# Patient Record
Sex: Female | Born: 1965 | Race: White | Hispanic: No | Marital: Single | Smoking: Current every day smoker
Health system: Southern US, Community
[De-identification: ages and names within clinical notes are randomized; demographics above are authoritative.]

## PROBLEM LIST (undated history)

## (undated) DIAGNOSIS — R002 Palpitations: Secondary | ICD-10-CM

## (undated) HISTORY — PX: APPENDECTOMY: SHX54

## (undated) HISTORY — PX: ABDOMINAL HYSTERECTOMY: SHX81

---

## 2015-08-26 DIAGNOSIS — N951 Menopausal and female climacteric states: Secondary | ICD-10-CM | POA: Insufficient documentation

## 2015-08-26 DIAGNOSIS — Z01419 Encounter for gynecological examination (general) (routine) without abnormal findings: Secondary | ICD-10-CM | POA: Insufficient documentation

## 2015-08-26 DIAGNOSIS — N809 Endometriosis, unspecified: Secondary | ICD-10-CM

## 2015-08-26 HISTORY — DX: Menopausal and female climacteric states: N95.1

## 2015-08-26 HISTORY — DX: Encounter for gynecological examination (general) (routine) without abnormal findings: Z01.419

## 2015-08-26 HISTORY — DX: Endometriosis, unspecified: N80.9

## 2015-09-16 DIAGNOSIS — Z90710 Acquired absence of both cervix and uterus: Secondary | ICD-10-CM

## 2015-09-16 HISTORY — DX: Acquired absence of both cervix and uterus: Z90.710

## 2015-11-28 DIAGNOSIS — A630 Anogenital (venereal) warts: Secondary | ICD-10-CM | POA: Insufficient documentation

## 2015-11-28 HISTORY — DX: Anogenital (venereal) warts: A63.0

## 2017-10-25 DIAGNOSIS — M79674 Pain in right toe(s): Secondary | ICD-10-CM

## 2017-10-25 DIAGNOSIS — G8929 Other chronic pain: Secondary | ICD-10-CM | POA: Insufficient documentation

## 2017-10-25 DIAGNOSIS — Z8781 Personal history of (healed) traumatic fracture: Secondary | ICD-10-CM | POA: Insufficient documentation

## 2017-10-25 DIAGNOSIS — R002 Palpitations: Secondary | ICD-10-CM

## 2017-10-25 HISTORY — DX: Personal history of (healed) traumatic fracture: Z87.81

## 2017-10-25 HISTORY — DX: Other chronic pain: G89.29

## 2017-10-25 HISTORY — DX: Palpitations: R00.2

## 2018-01-13 ENCOUNTER — Encounter (HOSPITAL_BASED_OUTPATIENT_CLINIC_OR_DEPARTMENT_OTHER): Payer: Self-pay

## 2018-01-13 ENCOUNTER — Other Ambulatory Visit: Payer: Self-pay | Admitting: Cardiology

## 2018-01-13 ENCOUNTER — Other Ambulatory Visit: Payer: Self-pay

## 2018-01-13 ENCOUNTER — Emergency Department (HOSPITAL_BASED_OUTPATIENT_CLINIC_OR_DEPARTMENT_OTHER)
Admission: EM | Admit: 2018-01-13 | Discharge: 2018-01-13 | Disposition: A | Discharge status: Home / Self Care | Payer: 59 | Attending: Emergency Medicine | Admitting: Emergency Medicine

## 2018-01-13 DIAGNOSIS — R55 Syncope and collapse: Secondary | ICD-10-CM

## 2018-01-13 DIAGNOSIS — I493 Ventricular premature depolarization: Secondary | ICD-10-CM | POA: Insufficient documentation

## 2018-01-13 DIAGNOSIS — R002 Palpitations: Secondary | ICD-10-CM | POA: Diagnosis present

## 2018-01-13 DIAGNOSIS — I498 Other specified cardiac arrhythmias: Secondary | ICD-10-CM

## 2018-01-13 DIAGNOSIS — F17210 Nicotine dependence, cigarettes, uncomplicated: Secondary | ICD-10-CM | POA: Diagnosis not present

## 2018-01-13 DIAGNOSIS — I499 Cardiac arrhythmia, unspecified: Secondary | ICD-10-CM

## 2018-01-13 HISTORY — DX: Palpitations: R00.2

## 2018-01-13 LAB — CBC WITH DIFFERENTIAL/PLATELET
BASOS PCT: 0 %
Basophils Absolute: 0 10*3/uL (ref 0.0–0.1)
EOS ABS: 0 10*3/uL (ref 0.0–0.7)
Eosinophils Relative: 1 %
HCT: 38 % (ref 36.0–46.0)
HEMOGLOBIN: 13.1 g/dL (ref 12.0–15.0)
Lymphocytes Relative: 39 %
Lymphs Abs: 2.4 10*3/uL (ref 0.7–4.0)
MCH: 34.2 pg — ABNORMAL HIGH (ref 26.0–34.0)
MCHC: 34.5 g/dL (ref 30.0–36.0)
MCV: 99.2 fL (ref 78.0–100.0)
Monocytes Absolute: 0.5 10*3/uL (ref 0.1–1.0)
Monocytes Relative: 8 %
NEUTROS PCT: 52 %
Neutro Abs: 3.1 10*3/uL (ref 1.7–7.7)
PLATELETS: 188 10*3/uL (ref 150–400)
RBC: 3.83 MIL/uL — AB (ref 3.87–5.11)
RDW: 12 % (ref 11.5–15.5)
WBC: 6 10*3/uL (ref 4.0–10.5)

## 2018-01-13 LAB — COMPREHENSIVE METABOLIC PANEL
ALK PHOS: 39 U/L (ref 38–126)
ALT: 17 U/L (ref 0–44)
AST: 21 U/L (ref 15–41)
Albumin: 4.2 g/dL (ref 3.5–5.0)
Anion gap: 9 (ref 5–15)
BUN: 14 mg/dL (ref 6–20)
CALCIUM: 8.9 mg/dL (ref 8.9–10.3)
CO2: 25 mmol/L (ref 22–32)
CREATININE: 0.6 mg/dL (ref 0.44–1.00)
Chloride: 104 mmol/L (ref 98–111)
Glucose, Bld: 94 mg/dL (ref 70–99)
Potassium: 4 mmol/L (ref 3.5–5.1)
Sodium: 138 mmol/L (ref 135–145)
TOTAL PROTEIN: 6.8 g/dL (ref 6.5–8.1)
Total Bilirubin: 0.8 mg/dL (ref 0.3–1.2)

## 2018-01-13 LAB — TSH: TSH: 0.93 u[IU]/mL (ref 0.350–4.500)

## 2018-01-13 LAB — TROPONIN I: Troponin I: <0.03 ng/mL (ref <0.03)

## 2018-01-13 MED ORDER — PROPRANOLOL HCL 20 MG PO TABS: 20.0000 mg | ORAL_TABLET | Freq: Every day | ORAL | 1 refills | Status: DC

## 2018-01-13 NOTE — ED Notes (Signed)
Patient lying flat for orthostatic vitals, will obtain at 1420

## 2018-01-13 NOTE — ED Provider Notes (Signed)
[facility] EMERGENCY DEPARTMENT Provider Note   CSN: [id number] Arrival date & time: 01/13/18  1245     History   Chief Complaint Chief Complaint  Patient presents with  . Palpitations    HPI Melanie Bush is a 52 y.o. female who presents the emergency department chief complaint of racing heart and presyncope.  Patient states she had onset of these symptoms in February of this year.  She was seen at an urgent care and started on propranolol.  She does have a history of Graves' thyroid thyroiditis and was treated with propylthiouracil with remission of her symptoms.  Patient states that today while she was at work she had onset of racing feeling in her heart.  She felt as if she was going to pass out.  She felt heaviness in her legs.  She denies chest pain or shortness of breath.  She states that it lasted for approximately 45 minutes she had to leave work.  Patient states that she has been taking her propranolol daily.    HPI  Past Medical History:  Diagnosis Date  . Palpitation     There are no active problems to display for this patient.   Past Surgical History:  Procedure Laterality Date  . ABDOMINAL HYSTERECTOMY       OB History   None      Home Medications    Prior to Admission medications   Medication Sig Start Date End Date Taking? Authorizing Provider  estradiol (ESTRACE) 1 MG tablet  11/15/17   [provider]  propranolol (INDERAL) 20 MG tablet TK 1/2 T PO QAM B BRE 12/19/17   [provider]    Family History No family history on file.  Social History Social History   Tobacco Use  . Smoking status: Current Every Day Smoker  . Smokeless tobacco: Never Used  Substance Use Topics  . Alcohol use: Yes    Comment: daily  . Drug use: Not Currently     Allergies   Patient has no known allergies.   Review of Systems Review of Systems  Ten systems reviewed and are negative for acute change, except as noted in the HPI.    Physical Exam Updated Vital Signs BP (!) 138/95   Pulse 67   Temp 98.1 F (36.7 C) (Oral)   Resp 13   Ht 5\' 7"  (1.702 m)   Wt 60.8 kg (134 lb)   SpO2 100%   BMI 20.99 kg/m    Physical Exam  Constitutional: She is oriented to person, place, and time. She appears well-developed and well-nourished. No distress.  HENT:  Head: Normocephalic and atraumatic.  Eyes: Conjunctivae are normal. No scleral icterus.  Neck: Normal range of motion.  Cardiovascular: Normal rate, regular rhythm and normal heart sounds. Exam reveals no gallop and no friction rub.  No murmur heard. Pulmonary/Chest: Effort normal and breath sounds normal. No respiratory distress.  Abdominal: Soft. Bowel sounds are normal. She exhibits no distension and no mass. There is no tenderness. There is no guarding.  Neurological: She is alert and oriented to person, place, and time.  Skin: Skin is warm and dry. She is not diaphoretic.  Psychiatric: Her behavior is normal.  Nursing note and vitals reviewed.    ED Treatments / Results  Labs (all labs ordered are listed, but only abnormal results are displayed) Labs Reviewed  CBC WITH DIFFERENTIAL/PLATELET - Abnormal; Notable for the following components:      Result Value   RBC  3.83 (*)    MCH 34.2 (*)    All other components within normal limits  COMPREHENSIVE METABOLIC PANEL  TROPONIN I  TSH    EKG EKG Interpretation  Date/Time:  Thursday January 13 2018 13:10:03 EDT Ventricular Rate:  70 PR Interval:    QRS Duration: 85 QT Interval:  396 QTC Calculation: 428 R Axis:   61 Text Interpretation:  Age not entered, assumed to be  52 years old for purpose of ECG interpretation Sinus rhythm EKG WITHIN NORMAL LIMITS Confirmed by Frederick PeersLittle, Rachel [id number]) on 01/13/2018 1:37:41 PM   Radiology No results found.  Procedures Procedures (including critical care time)  Medications Ordered in ED Medications - No data to display    Initial Impression / Assessment  and Plan / ED Course  I have reviewed the triage vital signs and the nursing notes.  Pertinent labs & imaging results that were available during my care of the patient were reviewed by me and considered in my medical decision making (see chart for details).    Ring the evaluation the patient grabbed her chest and states "I feel like it is happening."  On the monitor I was able to observe ventricular  Bigeminy.  Patient was only having episode lasting a few seconds at a time.  I discussed the case with Dr. Dietrich PatesPaula Ross who recommended increasing her dose of propranolol.  She is also set the patient up for cardiac event monitoring.  The patient has negative orthostatic vital signs, has no active chest pain, labs are reassuring, EKG is normal.  TSH is currently pending howeverPatient has close outpatient follow-up and appears appropriate for discharge at this time.  Final Clinical Impressions(s) / ED Diagnoses   Final diagnoses:  None    ED Discharge Orders    None      Arthor CaptainHarris, Czar Ysaguirre, PA-C 01/13/18 1940  Little, Ambrose Finlandachel Morgan, MD 01/14/18 [phone]

## 2018-01-13 NOTE — ED Notes (Signed)
Pt verbalizes understanding of d/c instructions and denies any further needs at this time. 

## 2018-01-13 NOTE — ED Triage Notes (Signed)
C/o palpitations, dizziness, near syncope x 3 days-reports hx of same and taking meds-NAD-steady gait

## 2018-01-13 NOTE — Discharge Instructions (Signed)
[facility] will contact you to set you up for cardiac event monitoring.  In the meantime the cardiologist has recommended that you increase your dose of propranolol to a full dose of 20 mg daily. Contact a health care provider if: You have a fever. You have vomiting or diarrhea that keeps happening (is persistent). Get help right away if: You have pain in your chest, upper arms, jaw, or neck. You become weak or dizzy. You feel faint. You have palpitations that do not go away.

## 2018-01-24 ENCOUNTER — Ambulatory Visit (INDEPENDENT_AMBULATORY_CARE_PROVIDER_SITE_OTHER): Payer: 59

## 2018-01-24 DIAGNOSIS — R55 Syncope and collapse: Secondary | ICD-10-CM | POA: Diagnosis not present

## 2018-01-31 ENCOUNTER — Encounter: Payer: Self-pay | Admitting: Cardiology

## 2018-01-31 ENCOUNTER — Ambulatory Visit (INDEPENDENT_AMBULATORY_CARE_PROVIDER_SITE_OTHER): Payer: 59 | Admitting: Cardiology

## 2018-01-31 VITALS — BP 140/90 | HR 67 | Ht 67.0 in | Wt 137.0 lb

## 2018-01-31 DIAGNOSIS — F17210 Nicotine dependence, cigarettes, uncomplicated: Secondary | ICD-10-CM

## 2018-01-31 DIAGNOSIS — R0789 Other chest pain: Secondary | ICD-10-CM

## 2018-01-31 DIAGNOSIS — R002 Palpitations: Secondary | ICD-10-CM | POA: Diagnosis not present

## 2018-01-31 HISTORY — DX: Other chest pain: R07.89

## 2018-01-31 HISTORY — DX: Nicotine dependence, cigarettes, uncomplicated: F17.210

## 2018-01-31 NOTE — Progress Notes (Signed)
Cardiology Office Note:    Date:  01/31/2018   ID:  Melanie Bush, DOB [birth year], MRN [id number]  PCP:  Melanie Collin, PA-C  Cardiologist:  Melanie Brothers, MD   Referring MD: Melanie Collin, PA-C    ASSESSMENT:    1. Palpitations   2. Chest tightness   3. Cigarette smoker    PLAN:    In order of problems listed above:  1. Primary prevention stressed with the patient.  Importance of compliance with diet and medication stressed.  Her blood pressure is stable 2. Her lab work was reviewed and her EKG.  They are unremarkable.  TSH is fine. 3. I will review the monitor findings when they are available.  It will be really useful in managing her. 4. In view of chest tightness she will undergo stress echo.  She has multiple risk factors for coronary artery disease. 5. I spent 5 minutes with the patient discussing solely about smoking. Smoking cessation was counseled. I suggested to the patient also different medications and pharmacological interventions. Patient is keen to try stopping on its own at this time. He will get back to me if he needs any further assistance in this matter. 6. 4 months follow-up or earlier if she has any concerns.   Medication Adjustments/Labs and Tests Ordered: Current medicines are reviewed at length with the patient today.  Concerns regarding medicines are outlined above.  No orders of the defined types were placed in this encounter.  No orders of the defined types were placed in this encounter.    History of Present Illness:    Melanie Bush is a 52 y.o. female who is being seen today for the evaluation of chest pain and palpitations at the request of Melanie Bush, [place].  Patient is a pleasant 52 year old female.  She has no significant past medical history.  She mentions to me that she has had Graves' disease and was treated for this.  She has been noticing significant palpitations over the past several weeks.  For this reason she went to the  emergency room.  She now has a event monitor that she is wearing.  She does me that her propranolol was increased.  She continues to experience palpitations many times a day.  No orthopnea or PND.  Unfortunately she is a smoker and smokes more than a pack a day.  She occasionally has chest tightness which may or not be related to exertion.  No radiation to any part of the body.  At the time of my evaluation, the patient is alert awake oriented and in no distress.  Past Medical History:  Diagnosis Date  . Palpitation     Past Surgical History:  Procedure Laterality Date  . ABDOMINAL HYSTERECTOMY    . APPENDECTOMY      Current Medications: Current Meds  Medication Sig  . estradiol (ESTRACE) 1 MG tablet   . propranolol (INDERAL) 20 MG tablet Take 1 tablet (20 mg total) by mouth daily.     Allergies:   Patient has no known allergies.   Social History   Socioeconomic History  . Marital status: Single    Spouse name: Not on file  . Number of children: Not on file  . Years of education: Not on file  . Highest education level: Not on file  Occupational History  . Not on file  Social Needs  . Financial resource strain: Not on file  . Food insecurity:    Worry: Not on file  Inability: Not on file  . Transportation needs:    Medical: Not on file    Non-medical: Not on file  Tobacco Use  . Smoking status: Current Every Day Smoker  . Smokeless tobacco: Never Used  Substance and Sexual Activity  . Alcohol use: Yes    Alcohol/week: 0.6 - 1.2 oz    Types: 1 - 2 Glasses of wine per week    Comment: daily  . Drug use: Not Currently  . Sexual activity: Not on file  Lifestyle  . Physical activity:    Days per week: Not on file    Minutes per session: Not on file  . Stress: Not on file  Relationships  . Social connections:    Talks on phone: Not on file    Gets together: Not on file    Attends religious service: Not on file    Active member of club or organization: Not on  file    Attends meetings of clubs or organizations: Not on file    Relationship status: Not on file  Other Topics Concern  . Not on file  Social History Narrative  . Not on file     Family History: The patient's family history includes Healthy in her mother.  ROS:   Please see the history of present illness.    All other systems reviewed and are negative.  EKGs/Labs/Other Studies Reviewed:    The following studies were reviewed today: I discussed my findings with her at extensive length.  EKG is unremarkable   Recent Labs: 01/13/2018: ALT 17; BUN 14; Creatinine, Ser 0.60; Hemoglobin 13.1; Platelets 188; Potassium 4.0; Sodium 138; TSH 0.930  Recent Lipid Panel No results found for: CHOL, TRIG, HDL, CHOLHDL, VLDL, LDLCALC, LDLDIRECT  Physical Exam:    VS:  BP 140/90   Pulse 67   Ht 5\' 7"  (1.702 m)   Wt 137 lb (62.1 kg)   SpO2 98%   BMI 21.46 kg/m     Wt Readings from Last 3 Encounters:  01/31/18 137 lb (62.1 kg)  01/13/18 134 lb (60.8 kg)     GEN: Patient is in no acute distress HEENT: Normal NECK: No JVD; No carotid bruits LYMPHATICS: No lymphadenopathy CARDIAC: S1 S2 regular, 2/6 systolic murmur at the apex. RESPIRATORY:  Clear to auscultation without rales, wheezing or rhonchi  ABDOMEN: Soft, non-tender, non-distended MUSCULOSKELETAL:  No edema; No deformity  SKIN: Warm and dry NEUROLOGIC:  Alert and oriented x 3 PSYCHIATRIC:  Normal affect    Signed, Melanie Brothersajan R Doriann Zuch, MD  01/31/2018 4:14 PM    [facility]

## 2018-01-31 NOTE — Patient Instructions (Signed)
Medication Instructions:  Your physician recommends that you continue on your current medications as directed. Please refer to the Current Medication list given to you today.   Labwork: None  Testing/Procedures: Your physician has requested that you have a stress echocardiogram. For further information please visit [email or web address]. Please follow instruction sheet as given.  Follow-Up: Your physician wants you to follow-up in: 3 months. You will receive a reminder letter in the mail two months in advance. If you don't receive a letter, please call our office to schedule the follow-up appointment.   If you need a refill on your cardiac medications before your next appointment, please call your pharmacy.   Thank you for choosing [facility]! Mady GemmaCatherine Dalesha Stanback, RN [phone]     Exercise Stress Electrocardiogram An exercise stress electrocardiogram is a test to check how blood flows to your heart. It is done to find areas of poor blood flow. You will need to walk on a treadmill for this test. The electrocardiogram will record your heartbeat when you are at rest and when you are exercising. What happens before the procedure?  Do not have drinks with caffeine or foods with caffeine for 24 hours before the test, or as told by your doctor. This includes coffee, tea (even decaf tea), sodas, chocolate, and cocoa.  Follow your doctor's instructions about eating and drinking before the test.  Ask your doctor what medicines you should or should not take before the test. Take your medicines with water unless told by your doctor not to.  If you use an inhaler, bring it with you to the test.  Bring a snack to eat after the test.  Do not  smoke for 4 hours before the test.  Do not put lotions, powders, creams, or oils on your chest before the test.  Wear comfortable shoes and clothing. What happens during the procedure?  You will have patches put on your chest. Small areas of  your chest may need to be shaved. Wires will be connected to the patches.  Your heart rate will be watched while you are resting and while you are exercising.  You will walk on the treadmill. The treadmill will slowly get faster to raise your heart rate.  The test will take about 1-2 hours. What happens after the procedure?  Your heart rate and blood pressure will be watched after the test.  You may return to your normal diet, activities, and medicines or as told by your doctor. This information is not intended to replace advice given to you by your health care provider. Make sure you discuss any questions you have with your health care provider. Document Released: 12/02/2007 Document Revised: 02/12/2016 Document Reviewed: 02/20/2013 Elsevier Interactive Patient Education  [employer]2018 Elsevier Inc.

## 2018-01-31 NOTE — Addendum Note (Signed)
Addended by: Crist FatLOCKHART, Saree Krogh P on: 01/31/2018 04:29 PM   Modules accepted: Orders

## 2018-02-01 ENCOUNTER — Telehealth: Payer: Self-pay | Admitting: Cardiology

## 2018-02-01 NOTE — Telephone Encounter (Signed)
I received a page from MarvellJoseph at [employer] to report a critical ECG. I called back and was told that an 8 second pause was noted on the event monitor. Follow up rhythm was bradycardia in the 30's. He was not sure if it was lead loss or true pause. He was unable to contact the patient. He would fax me the rhythm strip.  I tried to reach the patient at phone number on file with no answer. I tried her mother, listed as DPR, with no answer. I tried the patient again with no response. I called 911 to respond to her home and check on the patient.  After my 911 call the patient called me back and reported that she was fine. Her leads were loose so she removed them, showered and then replaced them. She denies any palpitations, lightheadedness, presyncope or syncope. She will call the 911 dispatch to report that she is fine.  We discussed symptoms to report and when to call 911.   Berton BonJanine Adalai Perl, AGNP-C [facility] 02/01/2018  7:17 PM

## 2018-02-03 ENCOUNTER — Telehealth: Payer: Self-pay

## 2018-02-03 ENCOUNTER — Ambulatory Visit (HOSPITAL_BASED_OUTPATIENT_CLINIC_OR_DEPARTMENT_OTHER): Payer: 59

## 2018-02-03 NOTE — Telephone Encounter (Signed)
Melanie Bush spoke with pt and verified that pt had monitor off to take shower. Disregard abnormal report.

## 2018-02-03 NOTE — Telephone Encounter (Signed)
Left voicemail for the patient to call the office to discuss abnormal monitor report.

## 2018-02-08 ENCOUNTER — Other Ambulatory Visit: Payer: Self-pay | Admitting: *Deleted

## 2018-02-08 ENCOUNTER — Ambulatory Visit (HOSPITAL_BASED_OUTPATIENT_CLINIC_OR_DEPARTMENT_OTHER)
Admission: RE | Admit: 2018-02-08 | Discharge: 2018-02-08 | Disposition: A | Discharge status: Home / Self Care | Payer: 59 | Source: Ambulatory Visit | Attending: Cardiology | Admitting: Cardiology

## 2018-02-08 DIAGNOSIS — R002 Palpitations: Secondary | ICD-10-CM

## 2018-02-08 DIAGNOSIS — Z720 Tobacco use: Secondary | ICD-10-CM | POA: Insufficient documentation

## 2018-02-08 DIAGNOSIS — R0789 Other chest pain: Secondary | ICD-10-CM | POA: Diagnosis present

## 2018-02-08 MED ORDER — ACEBUTOLOL HCL 400 MG PO CAPS: 400.0000 mg | ORAL_CAPSULE | Freq: Every day | ORAL | 3 refills | Status: DC

## 2018-02-08 NOTE — Progress Notes (Signed)
  Echocardiogram Echocardiogram Stress Test has been performed.  Melanie Bush [name] 02/08/2018, 3:14 PM

## 2018-04-27 DIAGNOSIS — I498 Other specified cardiac arrhythmias: Secondary | ICD-10-CM | POA: Insufficient documentation

## 2018-04-27 DIAGNOSIS — I10 Essential (primary) hypertension: Secondary | ICD-10-CM

## 2018-04-27 DIAGNOSIS — I499 Cardiac arrhythmia, unspecified: Secondary | ICD-10-CM

## 2018-04-27 HISTORY — DX: Other specified cardiac arrhythmias: I49.8

## 2018-04-27 HISTORY — DX: Essential (primary) hypertension: I10

## 2018-05-09 ENCOUNTER — Encounter: Payer: Self-pay | Admitting: Cardiology

## 2018-05-09 ENCOUNTER — Ambulatory Visit (INDEPENDENT_AMBULATORY_CARE_PROVIDER_SITE_OTHER): Payer: 59 | Admitting: Cardiology

## 2018-05-09 VITALS — BP 128/66 | HR 69 | Ht 67.0 in | Wt 139.2 lb

## 2018-05-09 DIAGNOSIS — I10 Essential (primary) hypertension: Secondary | ICD-10-CM | POA: Diagnosis not present

## 2018-05-09 DIAGNOSIS — F17210 Nicotine dependence, cigarettes, uncomplicated: Secondary | ICD-10-CM | POA: Diagnosis not present

## 2018-05-09 DIAGNOSIS — I493 Ventricular premature depolarization: Secondary | ICD-10-CM

## 2018-05-09 DIAGNOSIS — R002 Palpitations: Secondary | ICD-10-CM | POA: Diagnosis not present

## 2018-05-09 HISTORY — DX: Ventricular premature depolarization: I49.3

## 2018-05-09 NOTE — Patient Instructions (Signed)
Medication Instructions:  Your physician recommends that you continue on your current medications as directed. Please refer to the Current Medication list given to you today.  If you need a refill on your cardiac medications before your next appointment, please call your pharmacy.   Lab work: None.  If you have labs (blood work) drawn today and your tests are completely normal, you will receive your results only by: . MyChart Message (if you have MyChart) OR . A paper copy in the mail If you have any lab test that is abnormal or we need to change your treatment, we will call you to review the results.  Testing/Procedures: None.   Follow-Up: At [facility], you and your health needs are our priority.  As part of our continuing mission to provide you with exceptional heart care, we have created designated Provider Care Teams.  These Care Teams include your primary Cardiologist (physician) and Advanced Practice Providers (APPs -  Physician Assistants and Nurse Practitioners) who all work together to provide you with the care you need, when you need it. You will need a follow up appointment in 6 months.  Please call our office 2 months in advance to schedule this appointment.  You may see No primary care provider on file. or another member of our [facility] Provider Team in [place]: Robert Krasowski, MD . Brian Munley, MD  Any Other Special Instructions Will Be Listed Below (If Applicable).     

## 2018-05-09 NOTE — Progress Notes (Signed)
Cardiology Office Note:    Date:  05/09/2018   ID:  Melanie Bush, DOB Sep 22, 1965, MRN [id number]  PCP:  Jamal Collin, PA-C  Cardiologist:  Garwin Brothers, MD   Referring MD: Jamal Collin, PA-C    ASSESSMENT:    1. Frequent PVCs   2. Essential hypertension   3. Palpitations   4. Cigarette smoker    PLAN:    In order of problems listed above:  1. Primary prevention stressed with the patient.  Importance of compliance with diet and medication stressed and she vocalized understanding.  Blood pressure is stable.  I told her to consider cutting down her lisinopril to half dose and eventually she may stop it if it is causing her symptoms.  She will keep a track of her blood pressures and let us know.  She also recently had a complete physical and its blood work and was told that overall it was fine. 2. I spent 5 minutes with the patient discussing solely about smoking. Smoking cessation was counseled. I suggested to the patient also different medications and pharmacological interventions. Patient is keen to try stopping on its own at this time. He will get back to me if he needs any further assistance in this matter. 3. Patient will be seen in follow-up appointment in 6 months or earlier if the patient has any concerns    Medication Adjustments/Labs and Tests Ordered: Current medicines are reviewed at length with the patient today.  Concerns regarding medicines are outlined above.  No orders of the defined types were placed in this encounter.  No orders of the defined types were placed in this encounter.    No chief complaint on file.    History of Present Illness:    Melanie Bush is a 52 y.o. female.  Patient was evaluated by me for palpitations.  Patient had multiple PVCs on the monitor.  Echocardiogram rather stress echocardiogram revealed a normal-appearing left ventricle with preserved systolic function and good augmentation of the segments at stress.  Subsequently  my partner initiated her on beta-blocker and she tells me that she feels much better.  No chest pain orthopnea or PND.  At the time of my evaluation, the patient is alert awake oriented and in no distress.  Patient mentions to me that occasionally when she changes posture she feels a little lightheaded and that she has recently been initiated on lisinopril 10 mg daily for elevated blood pressure.  She had elevated blood pressure at the office.  She feels that her blood pressure is much better at home and this might be an element of an isolated episode of elevated blood pressure.  Unfortunately she continues to smoke  Past Medical History:  Diagnosis Date  . Palpitation     Past Surgical History:  Procedure Laterality Date  . ABDOMINAL HYSTERECTOMY    . APPENDECTOMY      Current Medications: Current Meds  Medication Sig  . acebutolol (SECTRAL) 400 MG capsule Take 1 capsule (400 mg total) by mouth daily.  Marland Kitchen estradiol (ESTRACE) 1 MG tablet   . lisinopril (PRINIVIL,ZESTRIL) 10 MG tablet Take 1 tablet by mouth daily.     Allergies:   Amoxicillin and Penicillin g   Social History   Socioeconomic History  . Marital status: Single    Spouse name: Not on file  . Number of children: Not on file  . Years of education: Not on file  . Highest education level: Not on file  Occupational History  .  Not on file  Social Needs  . Financial resource strain: Not on file  . Food insecurity:    Worry: Not on file    Inability: Not on file  . Transportation needs:    Medical: Not on file    Non-medical: Not on file  Tobacco Use  . Smoking status: Current Every Day Smoker  . Smokeless tobacco: Never Used  Substance and Sexual Activity  . Alcohol use: Yes    Alcohol/week: 1.0 - 2.0 standard drinks    Types: 1 - 2 Glasses of wine per week    Comment: daily  . Drug use: Not Currently  . Sexual activity: Not on file  Lifestyle  . Physical activity:    Days per week: Not on file    Minutes  per session: Not on file  . Stress: Not on file  Relationships  . Social connections:    Talks on phone: Not on file    Gets together: Not on file    Attends religious service: Not on file    Active member of club or organization: Not on file    Attends meetings of clubs or organizations: Not on file    Relationship status: Not on file  Other Topics Concern  . Not on file  Social History Narrative  . Not on file     Family History: The patient's family history includes Healthy in her mother.  ROS:   Please see the history of present illness.    All other systems reviewed and are negative.  EKGs/Labs/Other Studies Reviewed:    The following studies were reviewed today: I discussed my findings with the patient at extensive length.   Recent Labs: 01/13/2018: ALT 17; BUN 14; Creatinine, Ser 0.60; Hemoglobin 13.1; Platelets 188; Potassium 4.0; Sodium 138; TSH 0.930  Recent Lipid Panel No results found for: CHOL, TRIG, HDL, CHOLHDL, VLDL, LDLCALC, LDLDIRECT  Physical Exam:    VS:  BP 128/66 (BP Location: Right Arm, Patient Position: Sitting, Cuff Size: Normal)   Pulse 69   Ht 5\' 7"  (1.702 m)   Wt 139 lb 3.2 oz (63.1 kg)   SpO2 97%   BMI 21.80 kg/m     Wt Readings from Last 3 Encounters:  05/09/18 139 lb 3.2 oz (63.1 kg)  01/31/18 137 lb (62.1 kg)  01/13/18 134 lb (60.8 kg)     GEN: Patient is in no acute distress HEENT: Normal NECK: No JVD; No carotid bruits LYMPHATICS: No lymphadenopathy CARDIAC: Hear sounds regular, 2/6 systolic murmur at the apex. RESPIRATORY:  Clear to auscultation without rales, wheezing or rhonchi  ABDOMEN: Soft, non-tender, non-distended MUSCULOSKELETAL:  No edema; No deformity  SKIN: Warm and dry NEUROLOGIC:  Alert and oriented x 3 PSYCHIATRIC:  Normal affect   Signed, Garwin Brothers, MD  05/09/2018 1:38 PM    [facility]

## 2018-06-14 ENCOUNTER — Other Ambulatory Visit: Payer: Self-pay | Admitting: *Deleted

## 2018-06-14 ENCOUNTER — Telehealth: Payer: Self-pay | Admitting: *Deleted

## 2018-06-14 MED ORDER — ACEBUTOLOL HCL 400 MG PO CAPS
400.0000 mg | ORAL_CAPSULE | Freq: Every day | ORAL | 3 refills | Status: DC
Start: 2018-06-14 — End: 2018-10-11

## 2018-06-14 NOTE — Telephone Encounter (Signed)
*  STAT* If patient is at the pharmacy, call can be transferred to refill team.   1. Which medications need to be refilled? (please list name of each medication and dose if known) acebutolol 400 mg qd  2. Which pharmacy/location (including street and city if local pharmacy) is medication to be sent to?[facility], [address] [place]  3. Do they need a 30 day or 90 day supply? 30

## 2018-10-10 ENCOUNTER — Other Ambulatory Visit: Payer: Self-pay | Admitting: Cardiology

## 2018-10-11 ENCOUNTER — Telehealth: Payer: Self-pay

## 2018-10-11 NOTE — Telephone Encounter (Signed)
Left vm for patient to call back (recall) to schedule 6 mo f/u

## 2018-12-15 ENCOUNTER — Ambulatory Visit: Payer: 59 | Admitting: Cardiology

## 2019-01-03 ENCOUNTER — Ambulatory Visit: Payer: 59 | Admitting: Cardiology

## 2019-01-09 ENCOUNTER — Ambulatory Visit: Payer: 59 | Admitting: Cardiology

## 2019-01-10 ENCOUNTER — Other Ambulatory Visit: Payer: Self-pay

## 2019-01-10 ENCOUNTER — Encounter: Payer: Self-pay | Admitting: Cardiology

## 2019-01-10 ENCOUNTER — Ambulatory Visit (INDEPENDENT_AMBULATORY_CARE_PROVIDER_SITE_OTHER): Payer: 59 | Admitting: Cardiology

## 2019-01-10 VITALS — BP 110/66 | HR 79 | Ht 67.0 in | Wt 135.0 lb

## 2019-01-10 DIAGNOSIS — I493 Ventricular premature depolarization: Secondary | ICD-10-CM | POA: Diagnosis not present

## 2019-01-10 DIAGNOSIS — F17210 Nicotine dependence, cigarettes, uncomplicated: Secondary | ICD-10-CM

## 2019-01-10 DIAGNOSIS — I10 Essential (primary) hypertension: Secondary | ICD-10-CM

## 2019-01-10 NOTE — Addendum Note (Signed)
Addended by: Polly Cobia A on: 01/10/2019 03:56 PM   Modules accepted: Orders

## 2019-01-10 NOTE — Patient Instructions (Signed)
Medication Instructions:  Your physician recommends that you continue on your current medications as directed. Please refer to the Current Medication list given to you today.  If you need a refill on your cardiac medications before your next appointment, please call your pharmacy.   Lab work: Have labs drawn at [facility] in the next few days, nothing to eat or drink after midnight except water  If you have labs (blood work) drawn today and your tests are completely normal, you will receive your results only by: Marland Kitchen MyChart Message (if you have MyChart) OR . A paper copy in the mail If you have any lab test that is abnormal or we need to change your treatment, we will call you to review the results.  Testing/Procedures: None  Follow-Up: At [facility], you and your health needs are our priority.  As part of our continuing mission to provide you with exceptional heart care, we have created designated Provider Care Teams.  These Care Teams include your primary Cardiologist (physician) and Advanced Practice Providers (APPs -  Physician Assistants and Nurse Practitioners) who all work together to provide you with the care you need, when you need it. . You will need a follow up appointment in 6 months.

## 2019-01-10 NOTE — Progress Notes (Signed)
Cardiology Office Note:    Date:  01/10/2019   ID:  Melanie Bush, DOB 1965/10/19, MRN [id number]  PCP:  Ardith Dark, PA-C  Cardiologist:  Jenean Lindau, MD   Referring MD: Ardith Dark, PA-C    ASSESSMENT:    No diagnosis found. PLAN:    In order of problems listed above:  1. Frequent PVCs: These symptoms have resolved and she is feeling fine.  She is active and walking on a regular basis.  No chest pain orthopnea or PND.  She is very happy about it. 2. Cigarette smoker: I spent 5 minutes with the patient discussing solely about smoking. Smoking cessation was counseled. I suggested to the patient also different medications and pharmacological interventions. Patient is keen to try stopping on its own at this time. He will get back to me if he needs any further assistance in this matter. 3. Patient will be seen in follow-up appointment in 6 months or earlier if the patient has any concerns    Medication Adjustments/Labs and Tests Ordered: Current medicines are reviewed at length with the patient today.  Concerns regarding medicines are outlined above.  No orders of the defined types were placed in this encounter.  No orders of the defined types were placed in this encounter.    No chief complaint on file.    History of Present Illness:    Melanie Bush is a 53 y.o. female.  Patient has past medical history of frequent PVCs.  She is an active smoker.  She mentions to me that she stopped taking lisinopril because of cough.  Her blood pressure stable.  She denies any problems at this time and takes care of activities of daily living.  Unfortunately she cuts down on her smoking but has not completely eliminated it.  At the time of my evaluation, the patient is alert awake oriented and in no distress.  Past Medical History:  Diagnosis Date  . Palpitation     Past Surgical History:  Procedure Laterality Date  . ABDOMINAL HYSTERECTOMY    . APPENDECTOMY       Current Medications: Current Meds  Medication Sig  . acebutolol (SECTRAL) 400 MG capsule TAKE 1 CAPSULE(400 MG) BY MOUTH DAILY (Patient taking differently: Take 400 mg by mouth. )  . estradiol (ESTRACE) 1 MG tablet Take 0.5 mg by mouth daily.      Allergies:   Amoxicillin and Penicillin g   Social History   Socioeconomic History  . Marital status: Single    Spouse name: Not on file  . Number of children: Not on file  . Years of education: Not on file  . Highest education level: Not on file  Occupational History  . Not on file  Social Needs  . Financial resource strain: Not on file  . Food insecurity    Worry: Not on file    Inability: Not on file  . Transportation needs    Medical: Not on file    Non-medical: Not on file  Tobacco Use  . Smoking status: Current Every Day Smoker  . Smokeless tobacco: Never Used  Substance and Sexual Activity  . Alcohol use: Yes    Alcohol/week: 1.0 - 2.0 standard drinks    Types: 1 - 2 Glasses of wine per week    Comment: daily  . Drug use: Not Currently  . Sexual activity: Not on file  Lifestyle  . Physical activity    Days per week: Not on file  Minutes per session: Not on file  . Stress: Not on file  Relationships  . Social Musicianconnections    Talks on phone: Not on file    Gets together: Not on file    Attends religious service: Not on file    Active member of club or organization: Not on file    Attends meetings of clubs or organizations: Not on file    Relationship status: Not on file  Other Topics Concern  . Not on file  Social History Narrative  . Not on file     Family History: The patient's family history includes Healthy in her mother.  ROS:   Please see the history of present illness.    All other systems reviewed and are negative.  EKGs/Labs/Other Studies Reviewed:    The following studies were reviewed today: EKG reveals sinus rhythm and nonspecific ST-T changes   Recent Labs: 01/13/2018: ALT 17; BUN 14;  Creatinine, Ser 0.60; Hemoglobin 13.1; Platelets 188; Potassium 4.0; Sodium 138; TSH 0.930  Recent Lipid Panel No results found for: CHOL, TRIG, HDL, CHOLHDL, VLDL, LDLCALC, LDLDIRECT  Physical Exam:    VS:  BP 110/66 (BP Location: Left Arm, Patient Position: Sitting, Cuff Size: Normal)   Pulse 79   Ht 5\' 7"  (1.702 m)   Wt 135 lb (61.2 kg)   SpO2 98%   BMI 21.14 kg/m     Wt Readings from Last 3 Encounters:  01/10/19 135 lb (61.2 kg)  05/09/18 139 lb 3.2 oz (63.1 kg)  01/31/18 137 lb (62.1 kg)     GEN: Patient is in no acute distress HEENT: Normal NECK: No JVD; No carotid bruits LYMPHATICS: No lymphadenopathy CARDIAC: Hear sounds regular, 2/6 systolic murmur at the apex. RESPIRATORY:  Clear to auscultation without rales, wheezing or rhonchi  ABDOMEN: Soft, non-tender, non-distended MUSCULOSKELETAL:  No edema; No deformity  SKIN: Warm and dry NEUROLOGIC:  Alert and oriented x 3 PSYCHIATRIC:  Normal affect   Signed, Garwin Brothersajan R Venancio Chenier, MD  01/10/2019 3:33 PM    [facility]

## 2019-01-11 ENCOUNTER — Ambulatory Visit: Payer: 59 | Admitting: Cardiology

## 2019-01-11 NOTE — Addendum Note (Signed)
Addended by: Beckey Rutter on: 01/11/2019 01:18 PM   Modules accepted: Orders

## 2019-01-14 LAB — CBC
Hematocrit: 37.4 % (ref 34.0–46.6)
Hemoglobin: 12.9 g/dL (ref 11.1–15.9)
MCH: 34.2 pg — ABNORMAL HIGH (ref 26.6–33.0)
MCHC: 34.5 g/dL (ref 31.5–35.7)
MCV: 99 fL — ABNORMAL HIGH (ref 79–97)
Platelets: 193 10*3/uL (ref 150–450)
RBC: 3.77 x10E6/uL (ref 3.77–5.28)
RDW: 12.4 % (ref 11.7–15.4)
WBC: 5 10*3/uL (ref 3.4–10.8)

## 2019-01-14 LAB — BASIC METABOLIC PANEL
BUN/Creatinine Ratio: 17 (ref 9–23)
BUN: 14 mg/dL (ref 6–24)
CO2: 22 mmol/L (ref 20–29)
Calcium: 9.1 mg/dL (ref 8.7–10.2)
Chloride: 104 mmol/L (ref 96–106)
Creatinine, Ser: 0.82 mg/dL (ref 0.57–1.00)
GFR calc Af Amer: 94 mL/min/{1.73_m2} (ref >59)
GFR calc non Af Amer: 81 mL/min/{1.73_m2} (ref >59)
Glucose: 84 mg/dL (ref 65–99)
Potassium: 4.3 mmol/L (ref 3.5–5.2)
Sodium: 141 mmol/L (ref 134–144)

## 2019-01-14 LAB — LIPID PANEL
Chol/HDL Ratio: 2 ratio (ref 0.0–4.4)
Cholesterol, Total: 173 mg/dL (ref 100–199)
HDL: 86 mg/dL (ref >39)
LDL Calculated: 68 mg/dL (ref 0–99)
Triglycerides: 97 mg/dL (ref 0–149)
VLDL Cholesterol Cal: 19 mg/dL (ref 5–40)

## 2019-01-14 LAB — HEPATIC FUNCTION PANEL
ALT: 25 IU/L (ref 0–32)
AST: 30 IU/L (ref 0–40)
Albumin: 4.6 g/dL (ref 3.8–4.9)
Alkaline Phosphatase: 45 IU/L (ref 39–117)
Bilirubin Total: 0.4 mg/dL (ref 0.0–1.2)
Bilirubin, Direct: 0.14 mg/dL (ref 0.00–0.40)
Total Protein: 6.3 g/dL (ref 6.0–8.5)

## 2019-01-14 LAB — TSH: TSH: 0.721 u[IU]/mL (ref 0.450–4.500)

## 2019-01-20 ENCOUNTER — Telehealth: Payer: Self-pay

## 2019-01-20 NOTE — Telephone Encounter (Signed)
lmtcb for results, copy sent to Dr.Hedgecock per Dr. Docia Furl request.

## 2019-01-20 NOTE — Telephone Encounter (Signed)
-----   Message from Jenean Lindau, MD sent at 01/16/2019 11:52 AM EDT ----- The results of the study is unremarkable. Please inform patient. I will discuss in detail at next appointment. Cc  primary care/referring physician Jenean Lindau, MD 01/16/2019 11:52 AM

## 2019-03-02 ENCOUNTER — Other Ambulatory Visit: Payer: Self-pay | Admitting: Cardiology

## 2019-03-28 ENCOUNTER — Other Ambulatory Visit: Payer: Self-pay

## 2019-03-28 ENCOUNTER — Encounter: Payer: Self-pay | Admitting: Family

## 2019-03-28 ENCOUNTER — Ambulatory Visit (INDEPENDENT_AMBULATORY_CARE_PROVIDER_SITE_OTHER): Payer: 59 | Admitting: Family

## 2019-03-28 VITALS — BP 142/94 | HR 68 | Temp 97.0°F | Ht 66.0 in | Wt 126.0 lb

## 2019-03-28 DIAGNOSIS — R0789 Other chest pain: Secondary | ICD-10-CM

## 2019-03-28 DIAGNOSIS — R002 Palpitations: Secondary | ICD-10-CM | POA: Diagnosis not present

## 2019-03-28 DIAGNOSIS — I493 Ventricular premature depolarization: Secondary | ICD-10-CM

## 2019-03-28 DIAGNOSIS — R55 Syncope and collapse: Secondary | ICD-10-CM | POA: Diagnosis not present

## 2019-03-28 MED ORDER — DILTIAZEM HCL 30 MG PO TABS: 30.0000 mg | ORAL_TABLET | Freq: Every day | ORAL | 2 refills | Status: DC | PRN

## 2019-03-28 NOTE — Progress Notes (Signed)
Office Visit    Patient Name: Melanie Bush Date of Encounter: 03/28/2019  Primary Care Provider:  Ardith Dark, PA-C Primary Cardiologist:  No primary care provider on file. Electrophysiologist:  None   Chief Complaint    Melanie Bush is a 53 y.o. female with a hx of PVC, Grave's disease, HTN presents today for rapid heart rates and elevated BP.    Past Medical History    Past Medical History:  Diagnosis Date  . Palpitation    Past Surgical History:  Procedure Laterality Date  . ABDOMINAL HYSTERECTOMY    . APPENDECTOMY      Allergies  Allergies  Allergen Reactions  . Amoxicillin Other (See Comments)    Yeast infection  . Penicillin G Other (See Comments)    Yeast infection    History of Present Illness    Melanie Bush is a 53 y.o. female with a hx of PVC, Grave's disease, HTN last seen by Dr. 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   Tells me her heart "started acting up" about a week ago. Heart beats quicker and harder. These episodes are a few seconds and happen a few times per day. Feels like her previous episodes of PVCs. She has felt great since addition of Acebutolol, but just noticed increased palpitations this last week.   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 Feels the room spinning in the morning. Reports dizziness and lightheadedness intermittently.    BP is up over the past couple of days - SBP 183 yesterday morning on an arm cuff at home. At family doctor Friday had BP 148/94. They recommended she call if she kept feeling poorly.   Has had bad diarrhea over the last week. Noted hx of IBS. Tells me this is improving and she has been careful to increase her oral fluid intake.   Stress: No recent stressful situations to report.  Tobacco: Working to cut back. Has not smoked in the last 3 days.  Caffeine: Caffeine 2-2.5 cups of coffee.  Alcohol: No wine since Friday. Typically a  glass every evening.   Previously on Lisinopril for HTN. Noted chronic cough. Subsequently stopped. Has not since been on BP lowering agent.   Reports Hx of Graves. Tells me she took a thyroid pill and Xanax. Eventually both were decreased ands stopped. This was approximately 2007-2011.   EKGs/Labs/Other Studies Reviewed:   The following studies were reviewed today: Long term monitor 12/2017 Baseline rhythm: Sinus with the baseline heart rate of 76   Minimum heart rate was recorded at 52 and maximum was 118/min   Atrial arrhythmia: None significant   Ventricular arrhythmia: Frequent PVCs and ventricular bigeminy frequently   Conduction abnormality: None significant   Symptoms: Palpitations and lightheadedness   Conclusion:  Patient's event monitoring was abnormal.  Patient had frequent PVCs and at times ventricular bigeminy.  This coincided with PVCs occurring frequently many at times.  Also there was a long pauses of about 8-seconds which we confirmed to be artifactual.  At that time the patient had taken off the monitor leads.  Stress Echo 02/08/2018 Study Conclusions   - Stress ECG conclusions: There were no stress arrhythmias or   conduction abnormalities. Frequent ventricular ectopy with   bigeminy, uncahnged during stress and recovery. The stress ECG   was normal. - Staged echo: There was no echocardiographic evidence for   stress-induced ischemia.  EKG:  EKG is ordered today.  The ekg ordered today demonstrates SR 64 bpm with right atrial enlargement noted by tall p-wave in lead II.   Recent Labs: 01/13/2019: ALT 25; BUN 14; Creatinine, Ser 0.82; Hemoglobin 12.9; Platelets 193; Potassium 4.3; Sodium 141; TSH 0.721  Recent Lipid Panel    Component Value Date/Time   CHOL 173 01/13/2019 0815   TRIG 97 01/13/2019 0815   HDL 86 01/13/2019 0815   CHOLHDL 2.0 01/13/2019 0815   LDLCALC 68 01/13/2019 0815    Home Medications   Current Meds  Medication Sig  . acebutolol  (SECTRAL) 400 MG capsule TAKE 1 CAPSULE(400 MG) BY MOUTH DAILY  . estradiol (ESTRACE) 1 MG tablet Take 0.5 mg by mouth daily.       Review of Systems      Review of Systems  Constitution: Negative for chills, fever and malaise/fatigue.  Cardiovascular: Positive for palpitations. Negative for chest pain, dyspnea on exertion, irregular heartbeat, leg swelling, near-syncope and orthopnea.  Respiratory: Negative for cough, shortness of breath and wheezing.   Endocrine:       "hot and flushed all the time"  Gastrointestinal: Positive for diarrhea. Negative for constipation, nausea and vomiting.  Neurological: Positive for dizziness and light-headedness. Negative for weakness.  Psychiatric/Behavioral: The patient is nervous/anxious.    All other systems reviewed and are otherwise negative except as noted above.  Physical Exam    VS:  BP (!) 142/94 (BP Location: Right Arm, Patient Position: Sitting)   Pulse 68   Ht 5\' 6"  (1.676 m)   Wt 126 lb (57.2 kg)   SpO2 99%   BMI 20.34 kg/m  , BMI Body mass index is 20.34 kg/m. GEN: Well nourished, well developed, in no acute distress. HEENT: normal Neck: Supple, no JVD, carotid bruits, or masses. Cardiac: RRR, no murmurs, rubs, or gallops. No clubbing, cyanosis, edema.  Radials/DP/PT 2+ and equal bilaterally.  Respiratory:  Respirations regular and unlabored, clear to auscultation bilaterally. GI: Soft, nontender, nondistended, BS + x 4. MS: No deformity or atrophy. Skin: Warm and dry, no rash. Neuro:  Strength and sensation are intact. Psych: Normal affect.  Assessment & Plan    1. Palpitations - Known hx of PVC on monitor 12/2017. Offered repeat monitor, she declined as she is continuing to pay off the monitor from last year. Reports one week history of increased episodes of "rapid, hard" heart rates. This occurred in the setting of recent diarrhea due to IBS. Associated with intermittent dizziness/lightheadedness. She has a known history  of Graves which was treated with medication and resolved. Reports new onset feeling "flushed and hot" all the time. Concern for thyroid abnormality.  Potential triggers: no excessive caffeine use, no smoking x3 days and episodes persist, drinks 1 glass of wine per night, no OTC medications used   Etiology PVCs worsened by dehydration vs Graves recurrence vs anxiety/stress.  EKG today SR with R atrial enlargement - no arrhthymias.   Consider echocardiogram if palpitations persist after recent GI upset of diarrhea.   BMET, TSH and free T4, magnesium today.   Start Cardizem 30mg  daily as needed for palpitations.  2. Dizziness/Lightheadedness - Reports "room spinning" in the morning and sporadic lightheadedness throughout the day. Etiology PVCs vs dehydration vs hypotension. Have asked her to check her BP daily. Recommend increased oral fluid intake in setting of recent diarrhea.   3. PVC - Noted on long term monitor 01/24/2018. Improved since addition of Acebutolol. Recent increased episodes of heart beating "rapidly  and forcefully", see above for plan.   4. HTN - BP elevated today 142/94. She will check BP daily and write down a log. We will discuss BP via phone call in 2 weeks. Goal BP <130/80. Consider transition to extended release Cardizem at that time.   5. Tobacco abuse - Has worked on cutting back. Has not smoked in the last 3 days. Encouraged smoking cessation.   Disposition: Will call in 2 weeks to follow up on BP/PRN Cardizem. Follow up in 4 month(s) with Dr. Tomie Chinaevankar.    Alver Sorrowaitlin S Annye Forrey, NP 03/28/2019, 2:08 PM

## 2019-03-28 NOTE — Patient Instructions (Signed)
Medication Instructions:  Your physician has recommended you make the following change in your medication:  START: Cardizem 30 mg tablet once daily as needed for palpitations  If you need a refill on your cardiac medications before your next appointment, please call your pharmacy.   Lab work: Your physician recommends that you return for lab work in today: BMET TSH Free T-4 Magnesium  If you have labs (blood work) drawn today and your tests are completely normal, you will receive your results only by:  MyChart Message (if you have MyChart) OR  A paper copy in the mail If you have any lab test that is abnormal or we need to change your treatment, we will call you to review the results.  Testing/Procedures: None Ordered  Follow-Up: At [facility], you and your health needs are our priority.  As part of our continuing mission to provide you with exceptional heart care, we have created designated Provider Care Teams.  These Care Teams include your primary Cardiologist (physician) and Advanced Practice Providers (APPs -  Physician Assistants and Nurse Practitioners) who all work together to provide you with the care you need, when you need it. You will need a follow up appointment as scheduled.  Please call our office 2 months in advance to schedule this appointment.  You may see Dr. Tomie China or another member of our [employer] Provider Team in [place]: Gypsy Balsam, MD  Norman Herrlich, MD  Gillian Shields, NP  Any Other Special Instructions Will Be Listed Below (If Applicable).  Blood Pressure Goal= less than 130/80 Hypertension, Adult Hypertension is another name for high blood pressure. High blood pressure forces your heart to work harder to pump blood. This can cause problems over time. There are two numbers in a blood pressure reading. There is a top number (systolic) over a bottom number (diastolic). It is best to have a blood pressure that is below 120/80. Healthy  choices can help lower your blood pressure, or you may need medicine to help lower it. What are the causes? The cause of this condition is not known. Some conditions may be related to high blood pressure. What increases the risk?  Smoking.  Having type 2 diabetes mellitus, high cholesterol, or both.  Not getting enough exercise or physical activity.  Being overweight.  Having too much fat, sugar, calories, or salt (sodium) in your diet.  Drinking too much alcohol.  Having long-term (chronic) kidney disease.  Having a family history of high blood pressure.  Age. Risk increases with age.  Race. You may be at higher risk if you are African American.  Gender. Men are at higher risk than women before age 55. After age 61, women are at higher risk than men.  Having obstructive sleep apnea.  Stress. What are the signs or symptoms?  High blood pressure may not cause symptoms. Very high blood pressure (hypertensive crisis) may cause: ? Headache. ? Feelings of worry or nervousness (anxiety). ? Shortness of breath. ? Nosebleed. ? A feeling of being sick to your stomach (nausea). ? Throwing up (vomiting). ? Changes in how you see. ? Very bad chest pain. ? Seizures. How is this treated?  This condition is treated by making healthy lifestyle changes, such as: ? Eating healthy foods. ? Exercising more. ? Drinking less alcohol.  Your health care provider may prescribe medicine if lifestyle changes are not enough to get your blood pressure under control, and if: ? Your top number is above 130. ? Your  bottom number is above 80.  Your personal target blood pressure may vary. Follow these instructions at home: Eating and drinking   If told, follow the DASH eating plan. To follow this plan: ? Fill one half of your plate at each meal with fruits and vegetables. ? Fill one fourth of your plate at each meal with whole grains. Whole grains include whole-wheat pasta, brown rice, and  whole-grain bread. ? Eat or drink low-fat dairy products, such as skim milk or low-fat yogurt. ? Fill one fourth of your plate at each meal with low-fat (lean) proteins. Low-fat proteins include fish, chicken without skin, eggs, beans, and tofu. ? Avoid fatty meat, cured and processed meat, or chicken with skin. ? Avoid pre-made or processed food.  Eat less than 1,500 mg of salt each day.  Do not drink alcohol if: ? Your doctor tells you not to drink. ? You are pregnant, may be pregnant, or are planning to become pregnant.  If you drink alcohol: ? Limit how much you use to:  0-1 drink a day for women.  0-2 drinks a day for men. ? Be aware of how much alcohol is in your drink. In the [place] one drink equals one 12 oz bottle of beer (355 mL), one 5 oz glass of wine (148 mL), or one 1 oz glass of hard liquor (44 mL). Lifestyle   Work with your doctor to stay at a healthy weight or to lose weight. Ask your doctor what the best weight is for you.  Get at least 30 minutes of exercise most days of the week. This may include walking, swimming, or biking.  Get at least 30 minutes of exercise that strengthens your muscles (resistance exercise) at least 3 days a week. This may include lifting weights or doing Pilates.  Do not use any products that contain nicotine or tobacco, such as cigarettes, e-cigarettes, and chewing tobacco. If you need help quitting, ask your doctor.  Check your blood pressure at home as told by your doctor.  Keep all follow-up visits as told by your doctor. This is important. Medicines  Take over-the-counter and prescription medicines only as told by your doctor. Follow directions carefully.  Do not skip doses of blood pressure medicine. The medicine does not work as well if you skip doses. Skipping doses also puts you at risk for problems.  Ask your doctor about side effects or reactions to medicines that you should watch for. Contact a doctor if you:  Think  you are having a reaction to the medicine you are taking.  Have headaches that keep coming back (recurring).  Feel dizzy.  Have swelling in your ankles.  Have trouble with your vision. Get help right away if you:  Get a very bad headache.  Start to feel mixed up (confused).  Feel weak or numb.  Feel faint.  Have very bad pain in your: ? Chest. ? Belly (abdomen).  Throw up more than once.  Have trouble breathing. Summary  Hypertension is another name for high blood pressure.  High blood pressure forces your heart to work harder to pump blood.  For most people, a normal blood pressure is less than 120/80.  Making healthy choices can help lower blood pressure. If your blood pressure does not get lower with healthy choices, you may need to take medicine. This information is not intended to replace advice given to you by your health care provider. Make sure you discuss any questions you have with your  health care provider. Document Released: 12/02/2007 Document Revised: 02/23/2018 Document Reviewed: 02/23/2018 Elsevier Patient Education  2020 Else[employer]   Premature Ventricular Contraction  A premature ventricular contraction (PVC) is a common kind of irregular heartbeat (arrhythmia). These contractions are extra heartbeats that start in the ventricles of the heart and occur too early in the normal sequence. During the PVC, the heart's normal electrical pathway is not used, so the beat is shorter and less effective. In most cases, these contractions come and go and do not require treatment. What are the causes? Common causes of the condition include:  Smoking.  Drinking alcohol.  Certain medicines.  Some illegal drugs.  Stress.  Caffeine. Certain medical conditions can also cause PVCs:  Heart failure.  Heart attack, or coronary artery disease.  Heart valve problems.  Changes in minerals in the blood (electrolytes).  Low blood oxygen levels or high  carbon dioxide levels. In many cases, the cause of this condition is not known. What are the signs or symptoms? The main symptom of this condition is fast or skipped heartbeats (palpitations). Other symptoms include:  Chest pain.  Shortness of breath.  Feeling tired.  Dizziness.  Difficulty exercising. In some cases, there are no symptoms. How is this diagnosed? This condition may be diagnosed based on:  Your medical history.  A physical exam. During the exam, the health care provider will check for irregular heartbeats.  Tests, such as: ? An ECG (electrocardiogram) to monitor the electrical activity of your heart. ? An ambulatory cardiac monitor. This device records your heartbeats for 24 hours or more. ? Stress tests to see how exercise affects your heart rhythm and blood supply. ? An echocardiogram. This test uses sound waves (ultrasound) to produce an image of your heart. ? An electrophysiology study (EPS). This test checks for electrical problems in your heart. How is this treated? Treatment for this condition depends on any underlying conditions, the type of PVCs that you are having, and how much the symptoms are interfering with your daily life. Possible treatments include:  Avoiding things that cause premature contractions (triggers). These include caffeine and alcohol.  Taking medicines if symptoms are severe or if the extra heartbeats are frequent.  Getting treatment for underlying conditions that cause PVCs.  Having an implantable cardioverter defibrillator (ICD), if you are at risk for a serious arrhythmia. The ICD is a small device that is inserted into your chest to monitor your heartbeat. When it senses an irregular heartbeat, it sends a shock to bring the heartbeat back to normal.  Having a procedure to destroy the portion of the heart tissue that sends out abnormal signals (catheter ablation). In some cases, no treatment is required. Follow these  instructions at home: Lifestyle  Do not use any products that contain nicotine or tobacco, such as cigarettes, e-cigarettes, and chewing tobacco. If you need help quitting, ask your health care provider.  Do not use illegal drugs.  Exercise regularly. Ask your health care provider what type of exercise is safe for you.  Try to get at least 7-9 hours of sleep each night, or as much as recommended by your health care provider.  Find healthy ways to manage stress. Avoid stressful situations when possible. Alcohol use  Do not drink alcohol if: ? Your health care provider tells you not to drink. ? You are pregnant, may be pregnant, or are planning to become pregnant. ? Alcohol triggers your episodes.  If you drink alcohol: ? Limit how much you  use to:  0-1 drink a day for women.  0-2 drinks a day for men.  Be aware of how much alcohol is in your drink. In the [place] one drink equals one 12 oz bottle of beer (355 mL), one 5 oz glass of wine (148 mL), or one 1 oz glass of hard liquor (44 mL). General instructions  Take over-the-counter and prescription medicines only as told by your health care provider.  If caffeine triggers episodes of PVC, do not eat, drink, or use anything with caffeine in it.  Keep all follow-up visits as told by your health care provider. This is important. Contact a health care provider if you:  Feel palpitations. Get help right away if you:  Have chest pain.  Have shortness of breath.  Have sweating for no reason.  Have nausea and vomiting.  Become light-headed or you faint. Summary  A premature ventricular contraction (PVC) is a common kind of irregular heartbeat (arrhythmia).  In most cases, these contractions come and go and do not require treatment.  You may need to wear an ambulatory cardiac monitor. This records your heartbeats for 24 hours or more.  Treatment depends on any underlying conditions, the type of PVCs that you are having,  and how much the symptoms are interfering with your daily life. This information is not intended to replace advice given to you by your health care provider. Make sure you discuss any questions you have with your health care provider. Document Released: 01/31/2004 Document Revised: 03/10/2018 Document Reviewed: 03/10/2018 Elsevier Patient Education  2020 [place]  Walking is a great form of exercise to increase your strength, endurance and overall fitness.  A walking program can help you start slowly and gradually build endurance as you go.  Everyone's ability is different, so each person's starting point will be different.  You do not have to follow them exactly.  The are just samples. You should simply find out what's right for you and stick to that program.   In the beginning, you'll start off walking 2-3 times a day for short distances.  As you get stronger, you'll be walking further at just 1-2 times per day.  A. You Can Walk For A Certain Length Of Time Each Day    Walk 5 minutes 3 times per day.  Increase 2 minutes every 2 days (3 times per day).  Work up to 25-30 minutes (1-2 times per day).   Example:   Day 1-2 5 minutes 3 times per day   Day 7-8 12 minutes 2-3 times per day   Day 13-14 25 minutes 1-2 times per day  B. You Can Walk For a Certain Distance Each Day     Distance can be substituted for time.    Example:   3 trips to mailbox (at road)   3 trips to corner of block   3 trips around the block  C. Go to local [employer] and use the track.    Walk for distance 30 min/day 5 days/week...around track  Or time  minutes  D.  Please only do the exercises that your therapist has initialed and dated

## 2019-03-29 ENCOUNTER — Telehealth: Payer: Self-pay | Admitting: Family

## 2019-03-29 LAB — TSH+FREE T4
Free T4: 1.36 ng/dL (ref 0.82–1.77)
TSH: 1.37 u[IU]/mL (ref 0.450–4.500)

## 2019-03-29 LAB — BASIC METABOLIC PANEL
BUN/Creatinine Ratio: 23 (ref 9–23)
BUN: 19 mg/dL (ref 6–24)
CO2: 22 mmol/L (ref 20–29)
Calcium: 10.3 mg/dL — ABNORMAL HIGH (ref 8.7–10.2)
Chloride: 97 mmol/L (ref 96–106)
Creatinine, Ser: 0.84 mg/dL (ref 0.57–1.00)
GFR calc Af Amer: 91 mL/min/{1.73_m2} (ref >59)
GFR calc non Af Amer: 79 mL/min/{1.73_m2} (ref >59)
Glucose: 95 mg/dL (ref 65–99)
Potassium: 4.9 mmol/L (ref 3.5–5.2)
Sodium: 137 mmol/L (ref 134–144)

## 2019-03-29 LAB — MAGNESIUM: Magnesium: 2.1 mg/dL (ref 1.6–2.3)

## 2019-03-29 NOTE — Telephone Encounter (Signed)
Called and spoke with [name]. She has taken her PRN Diltiazem one time for palpitations and noticed relief within 40 minutes. Has not checked her BP yet, plans to check everyday around 5pm. Reviewed lab results (normal K, magnesium, kidney function, thyroid panel) - no labs indicative of etiology of palpitations. We will check in in 2 weeks via telephone call per office note.   Loel Dubonnet, NP

## 2019-03-29 NOTE — Telephone Encounter (Signed)
Left message on voicemail per DPR with lab results.   Kidney function normal. Thyroid function normal. Electrolytes normal with exception of very mildly elevated calcium which is not of clinical concern. No clear etiology of increased palpitations. Continue with plan as detailed in office visit note.   Loel Dubonnet, NP

## 2019-03-29 NOTE — Telephone Encounter (Signed)
Has some questions about lab results

## 2019-04-14 ENCOUNTER — Telehealth: Payer: Self-pay | Admitting: Family

## 2019-04-14 NOTE — Telephone Encounter (Signed)
Called to check on her BP palpitations after addition of PRN Diltiazem.   Tells me her blood pressure is much better and "low like it used to be". She has been checking routinely with readings 110-112/76. Reports feeling very well. Her palpitations have decreased and the Cardizem is effective on an as-needed basis. She took it daily for the first few days and now has only needed it every few days.   Continue current plan. Follow up in 4 months with Dr. Geraldo Pitter as previously scheduled.   Loel Dubonnet, NP

## 2019-04-21 DIAGNOSIS — Z79890 Hormone replacement therapy (postmenopausal): Secondary | ICD-10-CM

## 2019-04-21 HISTORY — DX: Hormone replacement therapy: Z79.890

## 2019-07-10 ENCOUNTER — Other Ambulatory Visit: Payer: Self-pay | Admitting: Cardiology

## 2019-07-17 ENCOUNTER — Encounter: Payer: Self-pay | Admitting: Cardiology

## 2019-07-17 ENCOUNTER — Ambulatory Visit (INDEPENDENT_AMBULATORY_CARE_PROVIDER_SITE_OTHER): Payer: 59 | Admitting: Cardiology

## 2019-07-17 ENCOUNTER — Other Ambulatory Visit: Payer: Self-pay

## 2019-07-17 VITALS — BP 122/78 | HR 73 | Ht 66.0 in | Wt 131.0 lb

## 2019-07-17 DIAGNOSIS — I493 Ventricular premature depolarization: Secondary | ICD-10-CM | POA: Diagnosis not present

## 2019-07-17 DIAGNOSIS — F17210 Nicotine dependence, cigarettes, uncomplicated: Secondary | ICD-10-CM

## 2019-07-17 DIAGNOSIS — I10 Essential (primary) hypertension: Secondary | ICD-10-CM

## 2019-07-17 NOTE — Progress Notes (Signed)
Cardiology Office Note:    Date:  07/17/2019   ID:  [name], DOB 26-Jul-1965, MRN [id number]  PCP:  Ardith Dark, PA-C  Cardiologist:  Jenean Lindau, MD   Referring MD: Ardith Dark, PA-C    ASSESSMENT:    1. Essential hypertension   2. PVC's (premature ventricular contractions)   3. Cigarette smoker    PLAN:    In order of problems listed above:  1. Essential hypertension blood pressure is stable and she takes her medications regularly.  Diet was emphasized.  Importance of regular exercise stressed. 2. Mixed dyslipidemia: Lipids followed by primary care physician and she tells me that they were fine recently. 3. Cigarette smoker: I spent 5 minutes with the patient discussing solely about smoking. Smoking cessation was counseled. I suggested to the patient also different medications and pharmacological interventions. Patient is keen to try stopping on its own at this time. He will get back to me if he needs any further assistance in this matter. 4. History of PVCs: Patient asymptomatic at this time. 5. Patient will be seen in follow-up appointment in 6 months or earlier if the patient has any concerns     Medication Adjustments/Labs and Tests Ordered: Current medicines are reviewed at length with the patient today.  Concerns regarding medicines are outlined above.  No orders of the defined types were placed in this encounter.  No orders of the defined types were placed in this encounter.    Chief Complaint  Patient presents with  . Follow-up    6 MO FU      History of Present Illness:    Melanie Bush is a 54 y.o. female.  Patient has history of palpitations and PVCs.  She has history of essential hypertension.  He denies any problems at this time and takes care of activities of daily living.  No chest pain orthopnea or PND.  She works all day long and therefore tells me that she has been fine much time to exercise.  Past Medical History:  Diagnosis  Date  . Palpitation     Past Surgical History:  Procedure Laterality Date  . ABDOMINAL HYSTERECTOMY    . APPENDECTOMY      Current Medications: Current Meds  Medication Sig  . acebutolol (SECTRAL) 400 MG capsule TAKE 1 CAPSULE(400 MG) BY MOUTH DAILY  . diltiazem (CARDIZEM) 30 MG tablet Take 1 tablet (30 mg total) by mouth daily as needed (for palpitations).  Marland Kitchen estradiol (ESTRACE) 1 MG tablet Take 0.5 mg by mouth daily.      Allergies:   Amoxicillin and Penicillin g   Social History   Socioeconomic History  . Marital status: Single    Spouse name: Not on file  . Number of children: Not on file  . Years of education: Not on file  . Highest education level: Not on file  Occupational History  . Not on file  Tobacco Use  . Smoking status: Current Every Day Smoker  . Smokeless tobacco: Never Used  Substance and Sexual Activity  . Alcohol use: Yes    Alcohol/week: 1.0 - 2.0 standard drinks    Types: 1 - 2 Glasses of wine per week    Comment: daily  . Drug use: Not Currently  . Sexual activity: Not on file  Other Topics Concern  . Not on file  Social History Narrative  . Not on file   Social Determinants of Health   Financial Resource Strain:   . Difficulty of Paying  Living Expenses: Not on file  Food Insecurity:   . Worried About Programme researcher, broadcasting/film/video in the Last Year: Not on file  . Ran Out of Food in the Last Year: Not on file  Transportation Needs:   . Lack of Transportation (Medical): Not on file  . Lack of Transportation (Non-Medical): Not on file  Physical Activity:   . Days of Exercise per Week: Not on file  . Minutes of Exercise per Session: Not on file  Stress:   . Feeling of Stress : Not on file  Social Connections:   . Frequency of Communication with Friends and Family: Not on file  . Frequency of Social Gatherings with Friends and Family: Not on file  . Attends Religious Services: Not on file  . Active Member of Clubs or Organizations: Not on file    . Attends Banker Meetings: Not on file  . Marital Status: Not on file     Family History: The patient's family history includes Healthy in her mother.  ROS:   Please see the history of present illness.    All other systems reviewed and are negative.  EKGs/Labs/Other Studies Reviewed:    The following studies were reviewed today: I discussed my findings with the pt at length   Recent Labs: 01/13/2019: ALT 25; Hemoglobin 12.9; Platelets 193 03/28/2019: BUN 19; Creatinine, Ser 0.84; Magnesium 2.1; Potassium 4.9; Sodium 137; TSH 1.370  Recent Lipid Panel    Component Value Date/Time   CHOL 173 01/13/2019 0815   TRIG 97 01/13/2019 0815   HDL 86 01/13/2019 0815   CHOLHDL 2.0 01/13/2019 0815   LDLCALC 68 01/13/2019 0815    Physical Exam:    VS:  BP 122/78   Pulse 73   Ht 5\' 6"  (1.676 m)   Wt 131 lb (59.4 kg)   SpO2 99%   BMI 21.14 kg/m     Wt Readings from Last 3 Encounters:  07/17/19 131 lb (59.4 kg)  03/28/19 126 lb (57.2 kg)  01/10/19 135 lb (61.2 kg)     GEN: Patient is in no acute distress HEENT: Normal NECK: No JVD; No carotid bruits LYMPHATICS: No lymphadenopathy CARDIAC: Hear sounds regular, 2/6 systolic murmur at the apex. RESPIRATORY:  Clear to auscultation without rales, wheezing or rhonchi  ABDOMEN: Soft, non-tender, non-distended MUSCULOSKELETAL:  No edema; No deformity  SKIN: Warm and dry NEUROLOGIC:  Alert and oriented x 3 PSYCHIATRIC:  Normal affect   Signed, 01/12/19, MD  07/17/2019 4:03 PM    [facility]

## 2019-07-17 NOTE — Patient Instructions (Signed)

## 2019-11-07 ENCOUNTER — Other Ambulatory Visit: Payer: Self-pay | Admitting: Cardiology

## 2020-01-22 ENCOUNTER — Other Ambulatory Visit: Payer: Self-pay

## 2020-01-22 ENCOUNTER — Encounter: Payer: Self-pay | Admitting: Cardiology

## 2020-01-22 ENCOUNTER — Ambulatory Visit (INDEPENDENT_AMBULATORY_CARE_PROVIDER_SITE_OTHER): Payer: 59 | Admitting: Cardiology

## 2020-01-22 VITALS — BP 132/90 | HR 70 | Ht 66.0 in | Wt 135.0 lb

## 2020-01-22 DIAGNOSIS — I10 Essential (primary) hypertension: Secondary | ICD-10-CM | POA: Diagnosis not present

## 2020-01-22 DIAGNOSIS — F17210 Nicotine dependence, cigarettes, uncomplicated: Secondary | ICD-10-CM | POA: Diagnosis not present

## 2020-01-22 DIAGNOSIS — I493 Ventricular premature depolarization: Secondary | ICD-10-CM | POA: Diagnosis not present

## 2020-01-22 NOTE — Progress Notes (Signed)
Cardiology Office Note:    Date:  01/22/2020   ID:  Melanie Bush, DOB 1966-04-05, MRN [id number]  PCP:  Melanie Collin, PA-C  Cardiologist:  Garwin Brothers, MD   Referring MD: Melanie Collin, PA-C    ASSESSMENT:    1. Essential hypertension   2. PVC's (premature ventricular contractions)   3. Cigarette smoker    PLAN:    In order of problems listed above:  1. Primary prevention stressed with the patient.  Importance of compliance with diet medication stressed and she vocalized understanding.  She was advised to exercise on a regular basis at least 30 minutes a day 5 days a week and she promises to do so. 2. Essential hypertension: Blood pressure stable. 3. History of PVCs: Stable at this time and asymptomatic 4. Cigarette smoker: I spent 5 minutes with the patient discussing solely about smoking. Smoking cessation was counseled. I suggested to the patient also different medications and pharmacological interventions. Patient is keen to try stopping on its own at this time. He will get back to me if he needs any further assistance in this matter. 5. Other blood work including lipids followed by primary care physician.Patient will be seen in follow-up appointment in 6 months or earlier if the patient has any concerns   Medication Adjustments/Labs and Tests Ordered: Current medicines are reviewed at length with the patient today.  Concerns regarding medicines are outlined above.  No orders of the defined types were placed in this encounter.  No orders of the defined types were placed in this encounter.    Chief Complaint  Patient presents with  . Follow-up     History of Present Illness:    Melanie Bush is a 54 y.o. female.  Patient has past medical history of essential hypertension, PVCs.  Unfortunately she continues to smoke.  She works full-time and denies any problems at this time.  No chest pain orthopnea or PND.  She occasionally has history of palpitations and  this, underwent she has PVCs.  Otherwise she is fine.  No syncope or any such problems.  At the time of my evaluation, the patient is alert awake oriented and in no distress.   Past Medical History:  Diagnosis Date  . Palpitation     Past Surgical History:  Procedure Laterality Date  . ABDOMINAL HYSTERECTOMY    . APPENDECTOMY      Current Medications: Current Meds  Medication Sig  . acebutolol (SECTRAL) 400 MG capsule TAKE 1 CAPSULE(400 MG) BY MOUTH DAILY  . diltiazem (CARDIZEM) 30 MG tablet Take 1 tablet (30 mg total) by mouth daily as needed (for palpitations).  Marland Kitchen estradiol (ESTRACE) 0.5 MG tablet Take 0.5 mg by mouth daily.     Allergies:   Amoxicillin and Penicillin g   Social History   Socioeconomic History  . Marital status: Single    Spouse name: Not on file  . Number of children: Not on file  . Years of education: Not on file  . Highest education level: Not on file  Occupational History  . Not on file  Tobacco Use  . Smoking status: Current Every Day Smoker  . Smokeless tobacco: Never Used  Vaping Use  . Vaping Use: Never used  Substance and Sexual Activity  . Alcohol use: Yes    Alcohol/week: 1.0 - 2.0 standard drink    Types: 1 - 2 Glasses of wine per week    Comment: daily  . Drug use: Not Currently  . Sexual  activity: Not on file  Other Topics Concern  . Not on file  Social History Narrative  . Not on file   Social Determinants of Health   Financial Resource Strain:   . Difficulty of Paying Living Expenses:   Food Insecurity:   . Worried About Programme researcher, broadcasting/film/video in the Last Year:   . Barista in the Last Year:   Transportation Needs:   . Freight forwarder (Medical):   Marland Kitchen Lack of Transportation (Non-Medical):   Physical Activity:   . Days of Exercise per Week:   . Minutes of Exercise per Session:   Stress:   . Feeling of Stress :   Social Connections:   . Frequency of Communication with Friends and Family:   . Frequency of  Social Gatherings with Friends and Family:   . Attends Religious Services:   . Active Member of Clubs or Organizations:   . Attends Banker Meetings:   Marland Kitchen Marital Status:      Family History: The patient's family history includes Healthy in her mother.  ROS:   Please see the history of present illness.    All other systems reviewed and are negative.  EKGs/Labs/Other Studies Reviewed:    The following studies were reviewed today: I discussed my findings with the patient at extensive   Recent Labs: 03/28/2019: BUN 19; Creatinine, Ser 0.84; Magnesium 2.1; Potassium 4.9; Sodium 137; TSH 1.370  Recent Lipid Panel    Component Value Date/Time   CHOL 173 01/13/2019 0815   TRIG 97 01/13/2019 0815   HDL 86 01/13/2019 0815   CHOLHDL 2.0 01/13/2019 0815   LDLCALC 68 01/13/2019 0815    Physical Exam:    VS:  BP (!) 132/90 (BP Location: Right Arm, Patient Position: Sitting, Cuff Size: Normal)   Pulse 70   Ht 5\' 6"  (1.676 m)   Wt 135 lb (61.2 kg)   SpO2 99%   BMI 21.79 kg/m     Wt Readings from Last 3 Encounters:  01/22/20 135 lb (61.2 kg)  07/17/19 131 lb (59.4 kg)  03/28/19 126 lb (57.2 kg)     GEN: Patient is in no acute distress HEENT: Normal NECK: No JVD; No carotid bruits LYMPHATICS: No lymphadenopathy CARDIAC: Hear sounds regular, 2/6 systolic murmur at the apex. RESPIRATORY:  Clear to auscultation without rales, wheezing or rhonchi  ABDOMEN: Soft, non-tender, non-distended MUSCULOSKELETAL:  No edema; No deformity  SKIN: Warm and dry NEUROLOGIC:  Alert and oriented x 3 PSYCHIATRIC:  Normal affect   Signed, 03/30/19, MD  01/22/2020 4:19 PM    [facility]

## 2020-01-22 NOTE — Patient Instructions (Signed)
Medication Instructions:  Your physician recommends that you continue on your current medications as directed. Please refer to the Current Medication list given to you today.  *If you need a refill on your cardiac medications before your next appointment, please call your pharmacy*   Lab Work: None ordered  If you have labs (blood work) drawn today and your tests are completely normal, you will receive your results only by: . MyChart Message (if you have MyChart) OR . A paper copy in the mail If you have any lab test that is abnormal or we need to change your treatment, we will call you to review the results.   Testing/Procedures: None ordered   Follow-Up: At [facility], you and your health needs are our priority.  As part of our continuing mission to provide you with exceptional heart care, we have created designated Provider Care Teams.  These Care Teams include your primary Cardiologist (physician) and Advanced Practice Providers (APPs -  Physician Assistants and Nurse Practitioners) who all work together to provide you with the care you need, when you need it.  We recommend signing up for the patient portal called "MyChart".  Sign up information is provided on this After Visit Summary.  MyChart is used to connect with patients for Virtual Visits (Telemedicine).  Patients are able to view lab/test results, encounter notes, upcoming appointments, etc.  Non-urgent messages can be sent to your provider as well.   To learn more about what you can do with MyChart, go to [email or web address].    Your next appointment:   6 month(s)  The format for your next appointment:   In Person  Provider:   Rajan Revankar, MD   Other Instructions   

## 2020-01-23 ENCOUNTER — Telehealth: Payer: Self-pay | Admitting: Cardiology

## 2020-01-23 NOTE — Telephone Encounter (Signed)
Jennie from Medical Records is calling stating they had a form sent over to Dr. Tomie China by carrier on 12/26/19 that needs to be signed by Revankar and sent back, but they never received it. Please advise.

## 2020-01-24 NOTE — Telephone Encounter (Signed)
I do not see it however I am at the [facility] today, I will follow up on this tomorrow as I will be in [place].

## 2020-04-02 ENCOUNTER — Other Ambulatory Visit: Payer: Self-pay | Admitting: Cardiology

## 2020-07-24 ENCOUNTER — Other Ambulatory Visit: Payer: Self-pay

## 2020-07-24 DIAGNOSIS — R002 Palpitations: Secondary | ICD-10-CM | POA: Insufficient documentation

## 2020-07-26 ENCOUNTER — Encounter: Payer: Self-pay | Admitting: Cardiology

## 2020-07-26 ENCOUNTER — Ambulatory Visit (INDEPENDENT_AMBULATORY_CARE_PROVIDER_SITE_OTHER): Payer: 59 | Admitting: Cardiology

## 2020-07-26 ENCOUNTER — Other Ambulatory Visit: Payer: Self-pay

## 2020-07-26 VITALS — BP 160/104 | HR 75 | Ht 66.5 in | Wt 131.0 lb

## 2020-07-26 DIAGNOSIS — R197 Diarrhea, unspecified: Secondary | ICD-10-CM | POA: Insufficient documentation

## 2020-07-26 DIAGNOSIS — I493 Ventricular premature depolarization: Secondary | ICD-10-CM | POA: Diagnosis not present

## 2020-07-26 DIAGNOSIS — G4720 Circadian rhythm sleep disorder, unspecified type: Secondary | ICD-10-CM | POA: Insufficient documentation

## 2020-07-26 DIAGNOSIS — F17210 Nicotine dependence, cigarettes, uncomplicated: Secondary | ICD-10-CM

## 2020-07-26 DIAGNOSIS — I10 Essential (primary) hypertension: Secondary | ICD-10-CM | POA: Diagnosis not present

## 2020-07-26 DIAGNOSIS — I7300 Raynaud's syndrome without gangrene: Secondary | ICD-10-CM | POA: Insufficient documentation

## 2020-07-26 HISTORY — DX: Circadian rhythm sleep disorder, unspecified type: G47.20

## 2020-07-26 HISTORY — DX: Raynaud's syndrome without gangrene: I73.00

## 2020-07-26 HISTORY — DX: Diarrhea, unspecified: R19.7

## 2020-07-26 NOTE — Patient Instructions (Signed)
Medication Instructions:  No medication changes. *If you need a refill on your cardiac medications before your next appointment, please call your pharmacy*   Lab Work: None ordered If you have labs (blood work) drawn today and your tests are completely normal, you will receive your results only by: MyChart Message (if you have MyChart) OR A paper copy in the mail If you have any lab test that is abnormal or we need to change your treatment, we will call you to review the results.   Testing/Procedures:  We will order CT coronary calcium score. It will cost $99.00 and is not covered by insurance.  Please call [phone] to schedule.   [facility]  [address]  [place], [place] [address]    Follow-Up: At [facility], you and your health needs are our priority.  As part of our continuing mission to provide you with exceptional heart care, we have created designated Provider Care Teams.  These Care Teams include your primary Cardiologist (physician) and Advanced Practice Providers (APPs -  Physician Assistants and Nurse Practitioners) who all work together to provide you with the care you need, when you need it.  We recommend signing up for the patient portal called "MyChart".  Sign up information is provided on this After Visit Summary.  MyChart is used to connect with patients for Virtual Visits (Telemedicine).  Patients are able to view lab/test results, encounter notes, upcoming appointments, etc.  Non-urgent messages can be sent to your provider as well.   To learn more about what you can do with MyChart, go to [email or web address].    Your next appointment:   6 month(s)  The format for your next appointment:   In Person  Provider:   Rajan Revankar, MD   Other Instructions  Coronary Calcium Scan A coronary calcium scan is an imaging test used to look for deposits of plaque in the inner lining of the blood vessels of the heart (coronary arteries).  Plaque is made up of calcium, protein, and fatty substances. These deposits of plaque can partly clog and narrow the coronary arteries without producing any symptoms or warning signs. This puts a person at risk for a heart attack. This test is recommended for people who are at moderate risk for heart disease. The test can find plaque deposits before symptoms develop. Tell a health care provider about: Any allergies you have. All medicines you are taking, including vitamins, herbs, eye drops, creams, and over-the-counter medicines. Any problems you or family members have had with anesthetic medicines. Any blood disorders you have. Any surgeries you have had. Any medical conditions you have. Whether you are pregnant or may be pregnant. What are the risks? Generally, this is a safe procedure. However, problems may occur, including: Harm to a pregnant woman and her unborn baby. This test involves the use of radiation. Radiation exposure can be dangerous to a pregnant woman and her unborn baby. If you are pregnant or think you may be pregnant, you should not have this procedure done. Slight increase in the risk of cancer. This is because of the radiation involved in the test. What happens before the procedure? Ask your health care provider for any specific instructions on how to prepare for this procedure. You may be asked to avoid products that contain caffeine, tobacco, or nicotine for 4 hours before the procedure. What happens during the procedure? You will undress and remove any jewelry from your neck or chest. You will put on   a hospital gown. Sticky electrodes will be placed on your chest. The electrodes will be connected to an electrocardiogram (ECG) machine to record a tracing of the electrical activity of your heart. You will lie down on a curved bed that is attached to the CT scanner. You may be given medicine to slow down your heart rate so that clear pictures can be created. You will be  moved into the CT scanner, and the CT scanner will take pictures of your heart. During this time, you will be asked to lie still and hold your breath for 2-3 seconds at a time while each picture of your heart is being taken. The procedure may vary among health care providers and hospitals.    What happens after the procedure? You can get dressed. You can return to your normal activities. It is up to you to get the results of your procedure. Ask your health care provider, or the department that is doing the procedure, when your results will be ready. Summary A coronary calcium scan is an imaging test used to look for deposits of plaque in the inner lining of the blood vessels of the heart (coronary arteries). Plaque is made up of calcium, protein, and fatty substances. Generally, this is a safe procedure. Tell your health care provider if you are pregnant or may be pregnant. Ask your health care provider for any specific instructions on how to prepare for this procedure. A CT scanner will take pictures of your heart. You can return to your normal activities after the scan is done. This information is not intended to replace advice given to you by your health care provider. Make sure you discuss any questions you have with your health care provider. Document Revised: 01/03/2019 Document Reviewed: 01/03/2019 Elsevier Patient Education  2021 Elsevier Inc.  

## 2020-07-26 NOTE — Progress Notes (Signed)
Cardiology Office Note:    Date:  07/26/2020   ID:  Melanie Bush, DOB [birth year], MRN [id number]  PCP:  Jamal Collin, PA-C  Cardiologist:  Garwin Brothers, MD   Referring MD: Jamal Collin, PA-C    ASSESSMENT:    1. Essential hypertension   2. Frequent PVCs   3. Cigarette smoker    PLAN:    In order of problems listed above:  1. Primary prevention stressed with the patient.  Importance of compliance with diet medication stressed and she vocalized understanding. 2. Essential hypertension: Blood pressure stable and diet was discussed.  Lifestyle modification and salt intake issues were advised and she vocalized understanding.  I told her to walk at least half an hour a day 5 days a week and she vocalized understanding. 3. Cigarette smoker: I spent 5 minutes with the patient discussing solely about smoking. Smoking cessation was counseled. I suggested to the patient also different medications and pharmacological interventions. Patient is keen to try stopping on its own at this time. He will get back to me if he needs any further assistance in this matter. 4. Risk stratification: I will do a calcium scoring to understand her risk for coronary artery disease and events and she is agreeable. 5. She had her primary care physician to all blood work today including lipids and she will forward me the labs when they are available. 6. Patient will be seen in follow-up appointment in 6 months or earlier if the patient has any concerns    Medication Adjustments/Labs and Tests Ordered: Current medicines are reviewed at length with the patient today.  Concerns regarding medicines are outlined above.  No orders of the defined types were placed in this encounter.  No orders of the defined types were placed in this encounter.    No chief complaint on file.    History of Present Illness:    Melanie Bush is a 55 y.o. female.  Patient denies any problems at this time and takes care of  activities of daily living.  No chest pain orthopnea or PND.  She has history of PVCs and palpitations and unfortunately continues to smoke.  At the time of my evaluation, the patient is alert awake oriented and in no distress.  Her blood pressure is elevated and her primary care physician added amlodipine to her regimen today.  Past Medical History:  Diagnosis Date  . Bigeminy 04/27/2018   Treated cardiology [place]  Formatting of this note might be different from the original. Treated cardiology [place]  . Chest tightness 01/31/2018  . 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 Cigarette smoker 01/31/2018  . Condyloma acuminatum of vulva 11/28/2015  . Encounter for gynecological examination without abnormal finding 08/26/2015  . Endometriosis 08/26/2015  . Essential hypertension 04/27/2018  . Frequent PVCs 05/09/2018  . History of hysterectomy 09/16/2015  . History of toe fracture 10/25/2017   Formatting of this note might be different from the original. endometriosis  . Menopausal and female climacteric states 08/26/2015  . Palpitation   . Palpitations 10/25/2017  . PVC's (premature ventricular contractions) 05/09/2018    Past Surgical History:  Procedure Laterality Date  . ABDOMINAL HYSTERECTOMY    . APPENDECTOMY      Current Medications: Current Meds  Medication Sig  . acebutolol (SECTRAL) 400 MG capsule TAKE 1 CAPSULE(400 MG) BY MOUTH DAILY  . albuterol (VENTOLIN HFA) 108 (90 Base) MCG/ACT inhaler Inhale 2 puffs into the lungs every 4 (four) hours as  needed.  Marland Kitchen amLODipine (NORVASC) 5 MG tablet Take 5 mg by mouth daily.  Marland Kitchen diltiazem (CARDIZEM) 30 MG tablet Take 1 tablet (30 mg total) by mouth daily as needed (for palpitations).  Marland Kitchen estradiol (ESTRACE) 0.5 MG tablet Take 0.5 mg by mouth daily.     Allergies:   Amoxicillin and Penicillin g   Social History   Socioeconomic History  . Marital status: Single    Spouse name: Not on file  . Number of children: Not on file  . Years of  education: Not on file  . Highest education level: Not on file  Occupational History  . Not on file  Tobacco Use  . Smoking status: Current Every Day Smoker  . Smokeless tobacco: Never Used  Vaping Use  . Vaping Use: Never used  Substance and Sexual Activity  . Alcohol use: Yes    Alcohol/week: 1.0 - 2.0 standard drink    Types: 1 - 2 Glasses of wine per week    Comment: daily  . Drug use: Not Currently  . Sexual activity: Not on file  Other Topics Concern  . Not on file  Social History Narrative  . Not on file   Social Determinants of Health   Financial Resource Strain: Not on file  Food Insecurity: Not on file  Transportation Needs: Not on file  Physical Activity: Not on file  Stress: Not on file  Social Connections: Not on file     Family History: The patient's family history includes Healthy in her mother.  ROS:   Please see the history of present illness.    All other systems reviewed and are negative.  EKGs/Labs/Other Studies Reviewed:    The following studies were reviewed today: I discussed my findings with the patient at length EKG reveals sinus rhythm with nonspecific ST-T changes   Recent Labs: No results found for requested labs within last 8760 hours.  Recent Lipid Panel    Component Value Date/Time   CHOL 173 01/13/2019 0815   TRIG 97 01/13/2019 0815   HDL 86 01/13/2019 0815   CHOLHDL 2.0 01/13/2019 0815   LDLCALC 68 01/13/2019 0815    Physical Exam:    VS:  BP (!) 160/104   Pulse 75   Ht 5' 6.5" (1.689 m)   Wt 131 lb (59.4 kg)   SpO2 99%   BMI 20.83 kg/m     Wt Readings from Last 3 Encounters:  07/26/20 131 lb (59.4 kg)  01/22/20 135 lb (61.2 kg)  07/17/19 131 lb (59.4 kg)     GEN: Patient is in no acute distress HEENT: Normal NECK: No JVD; No carotid bruits LYMPHATICS: No lymphadenopathy CARDIAC: Hear sounds regular, 2/6 systolic murmur at the apex. RESPIRATORY:  Clear to auscultation without rales, wheezing or rhonchi   ABDOMEN: Soft, non-tender, non-distended MUSCULOSKELETAL:  No edema; No deformity  SKIN: Warm and dry NEUROLOGIC:  Alert and oriented x 3 PSYCHIATRIC:  Normal affect   Signed, Garwin Brothers, MD  07/26/2020 2:15 PM    [facility]

## 2020-07-26 NOTE — Addendum Note (Signed)
Addended by: Eleonore Chiquito on: 07/26/2020 02:46 PM   Modules accepted: Orders

## 2020-08-12 ENCOUNTER — Other Ambulatory Visit: Payer: Self-pay

## 2020-08-12 ENCOUNTER — Ambulatory Visit (INDEPENDENT_AMBULATORY_CARE_PROVIDER_SITE_OTHER)
Admission: RE | Admit: 2020-08-12 | Discharge: 2020-08-12 | Disposition: A | Discharge status: Home / Self Care | Payer: Self-pay | Source: Ambulatory Visit | Attending: Cardiology | Admitting: Cardiology

## 2020-08-12 DIAGNOSIS — F17210 Nicotine dependence, cigarettes, uncomplicated: Secondary | ICD-10-CM

## 2020-08-12 DIAGNOSIS — I10 Essential (primary) hypertension: Secondary | ICD-10-CM

## 2020-08-13 ENCOUNTER — Telehealth: Payer: Self-pay | Admitting: Cardiology

## 2020-08-13 NOTE — Telephone Encounter (Signed)
Melanie Bush is returning Shonda's call in regards to her CT results. Please advise.

## 2020-09-16 ENCOUNTER — Other Ambulatory Visit: Payer: Self-pay | Admitting: Cardiology

## 2021-01-13 ENCOUNTER — Other Ambulatory Visit: Payer: Self-pay

## 2021-01-14 ENCOUNTER — Other Ambulatory Visit: Payer: Self-pay

## 2021-01-14 ENCOUNTER — Encounter: Payer: Self-pay | Admitting: Cardiology

## 2021-01-14 ENCOUNTER — Ambulatory Visit (INDEPENDENT_AMBULATORY_CARE_PROVIDER_SITE_OTHER): Payer: 59 | Admitting: Cardiology

## 2021-01-14 VITALS — BP 134/78 | HR 88 | Ht 66.6 in | Wt 128.0 lb

## 2021-01-14 DIAGNOSIS — R002 Palpitations: Secondary | ICD-10-CM | POA: Diagnosis not present

## 2021-01-14 DIAGNOSIS — F17210 Nicotine dependence, cigarettes, uncomplicated: Secondary | ICD-10-CM | POA: Diagnosis not present

## 2021-01-14 DIAGNOSIS — I10 Essential (primary) hypertension: Secondary | ICD-10-CM | POA: Diagnosis not present

## 2021-01-14 NOTE — Progress Notes (Signed)
Cardiology Office Note:    Date:  01/14/2021   ID:  [name], DOB [birth year], MRN [id number]  PCP:  Jamal Collin, PA-C  Cardiologist:  Garwin Brothers, MD   Referring MD: Jamal Collin, PA-C    ASSESSMENT:    1. Essential hypertension   2. Palpitations   3. Cigarette smoker    PLAN:    In order of problems listed above:  Primary prevention stressed to the patient.  Importance of compliance with diet medication stressed and she vocalized understanding.  She was advised to walk at least half an hour a day 5 days a week and she promises to do so. Essential hypertension, blood pressure stable and diet was emphasized. Palpitations: These have resolved for the most part and happen occasionally and she is fine with it.  They do not bother her. Cigarette smoker: I spent 5 minutes with the patient discussing solely about smoking. Smoking cessation was counseled. I suggested to the patient also different medications and pharmacological interventions. Patient is keen to try stopping on its own at this time. He will get back to me if he needs any further assistance in this matter. Patient will be seen in follow-up appointment in 6 months or earlier if the patient has any concerns    Medication Adjustments/Labs and Tests Ordered: Current medicines are reviewed at length with the patient today.  Concerns regarding medicines are outlined above.  No orders of the defined types were placed in this encounter.  No orders of the defined types were placed in this encounter.    No chief complaint on file.    History of Present Illness:    Melanie Bush is a 55 y.o. female.  Patient has past medical history of palpitations, essential hypertension and unfortunately continues to smoke so she has reduced significantly.  No chest pain orthopnea or PND.  She walks half an hour 2-3 times a week.  With this she has no issues.  She experiences palpitations about once a month or less.  Is  happy about it.  At the time of my evaluation, the patient is alert awake oriented and in no distress.  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 endometriosis   Menopausal and female climacteric states 08/26/2015   Palpitation    Palpitations 10/25/2017   PVC's (premature ventricular contractions) 05/09/2018   Raynaud's disease without gangrene 07/26/2020    Past Surgical History:  Procedure Laterality Date   ABDOMINAL HYSTERECTOMY     APPENDECTOMY      Current Medications: Current Meds  Medication Sig   acebutolol (SECTRAL) 400 MG capsule Take 400 mg by mouth daily.   albuterol (VENTOLIN HFA) 108 (90 Base) MCG/ACT inhaler Inhale 2 puffs into the lungs every 4 (four) hours as needed for wheezing or shortness of breath.   amLODipine (NORVASC) 5 MG tablet Take 5 mg by  mouth daily.   amoxicillin (AMOXIL) 500 MG capsule Take 500 mg by mouth every 6 (six) hours.   diltiazem (CARDIZEM) 30 MG tablet Take 30 mg by mouth as needed for other. palpitations   estradiol (ESTRACE) 0.5 MG tablet Take 0.5 mg by mouth daily.     Allergies:   Amoxicillin and Penicillin g   Social History   Socioeconomic History   Marital status: Single    Spouse name: Not on file   Number of children: Not on file   Years of education: Not on file   Highest education level:  Not on file  Occupational History   Not on file  Tobacco Use   Smoking status: Every Day   Smokeless tobacco: Never  Vaping Use   Vaping Use: Never used  Substance and Sexual Activity   Alcohol use: Yes    Alcohol/week: 1.0 - 2.0 standard drink    Types: 1 - 2 Glasses of wine per week    Comment: daily   Drug use: Not Currently   Sexual activity: Not on file  Other Topics Concern   Not on file  Social History Narrative   Not on file   Social Determinants of Health   Financial Resource Strain: Not on file  Food Insecurity: Not on file  Transportation Needs: Not on file  Physical Activity: Not on file  Stress: Not on file  Social Connections: Not on file     Family History: The patient's family history includes Healthy in her mother.  ROS:   Please see the history of present illness.    All other systems reviewed and are negative.  EKGs/Labs/Other Studies Reviewed:    The following studies were reviewed today: I discussed my findings with the patient at length.   Recent Labs: No results found for requested labs within last 8760 hours.  Recent Lipid Panel    Component Value Date/Time   CHOL 173 01/13/2019 0815   TRIG 97 01/13/2019 0815   HDL 86 01/13/2019 0815   CHOLHDL 2.0 01/13/2019 0815   LDLCALC 68 01/13/2019 0815    Physical Exam:    VS:  BP 134/78   Pulse 88   Ht 5' 6.6" (1.692 m)   Wt 128 lb 0.6 oz (58.1 kg)   SpO2 97%   BMI 20.30 kg/m     Wt Readings from Last 3 Encounters:  01/14/21 128 lb 0.6 oz (58.1 kg)  07/26/20 131 lb (59.4 kg)  01/22/20 135 lb (61.2 kg)     GEN: Patient is in no acute distress HEENT: Normal NECK: No JVD; No carotid bruits LYMPHATICS: No lymphadenopathy CARDIAC: Hear sounds regular, 2/6 systolic murmur at the apex. RESPIRATORY:  Clear to auscultation without rales, wheezing or rhonchi  ABDOMEN: Soft, non-tender, non-distended MUSCULOSKELETAL:  No edema; No deformity  SKIN: Warm and dry NEUROLOGIC:  Alert and  oriented x 3 PSYCHIATRIC:  Normal affect   Signed, Garwin Brothers, MD  01/14/2021 4:28 PM    [facility]

## 2021-01-14 NOTE — Patient Instructions (Signed)

## 2021-03-15 ENCOUNTER — Other Ambulatory Visit: Payer: Self-pay | Admitting: Cardiology

## 2021-03-18 ENCOUNTER — Telehealth: Payer: Self-pay | Admitting: Cardiology

## 2021-03-18 NOTE — Telephone Encounter (Signed)
Left VM for pt to call back.

## 2021-03-18 NOTE — Telephone Encounter (Signed)
Pt c/o BP issue: STAT if pt c/o blurred vision, one-sided weakness or slurred speech  1. What are your last 5 BP readings?  174/96   2. Are you having any other symptoms (ex. Dizziness, headache, blurred vision, passed out)?  Yesterday morning she felt like felt like she was going to pass out. She said she left work , went home and laid down and felt a little better. She still does not feel well  3. What is your BP issue? Patient states her BP is high and is getting some palpitations.  She is not sure whether the palpitations raise her BP or the elevated BP gives her palpitations.   She wanted to come in and be seen by Dr. Tomie China.  The patient said she is at work but will check her phone periodically for messages from the office

## 2021-03-18 NOTE — Telephone Encounter (Signed)
Pt reports feeling some better. Appointment made and pt will keep a BP log for appointment. PT verbalized understanding and had no additional questions.

## 2021-03-18 NOTE — Telephone Encounter (Signed)
Spoke with pt who states that she has been having dizziness and 2 readings of high blood pressure. Pt states that she is at work and does not have a reading today. How do you advise?

## 2021-03-21 ENCOUNTER — Ambulatory Visit: Payer: 59 | Admitting: Cardiology

## 2021-05-28 ENCOUNTER — Emergency Department (HOSPITAL_BASED_OUTPATIENT_CLINIC_OR_DEPARTMENT_OTHER): Payer: 59

## 2021-05-28 ENCOUNTER — Emergency Department (HOSPITAL_BASED_OUTPATIENT_CLINIC_OR_DEPARTMENT_OTHER)
Admission: EM | Admit: 2021-05-28 | Discharge: 2021-05-28 | Disposition: A | Discharge status: Home / Self Care | Payer: 59 | Attending: Emergency Medicine | Admitting: Emergency Medicine

## 2021-05-28 ENCOUNTER — Other Ambulatory Visit: Payer: Self-pay

## 2021-05-28 ENCOUNTER — Encounter (HOSPITAL_BASED_OUTPATIENT_CLINIC_OR_DEPARTMENT_OTHER): Payer: Self-pay | Admitting: Emergency Medicine

## 2021-05-28 DIAGNOSIS — R197 Diarrhea, unspecified: Secondary | ICD-10-CM | POA: Diagnosis not present

## 2021-05-28 DIAGNOSIS — F17200 Nicotine dependence, unspecified, uncomplicated: Secondary | ICD-10-CM | POA: Diagnosis not present

## 2021-05-28 DIAGNOSIS — I10 Essential (primary) hypertension: Secondary | ICD-10-CM | POA: Diagnosis not present

## 2021-05-28 DIAGNOSIS — Z79899 Other long term (current) drug therapy: Secondary | ICD-10-CM | POA: Insufficient documentation

## 2021-05-28 DIAGNOSIS — R109 Unspecified abdominal pain: Secondary | ICD-10-CM | POA: Diagnosis not present

## 2021-05-28 DIAGNOSIS — R10A0 Flank pain, unspecified side: Secondary | ICD-10-CM

## 2021-05-28 LAB — URINALYSIS, ROUTINE W REFLEX MICROSCOPIC
Glucose, UA: NEGATIVE mg/dL
Hgb urine dipstick: NEGATIVE
Ketones, ur: NEGATIVE mg/dL
Leukocytes,Ua: NEGATIVE
Nitrite: NEGATIVE
Protein, ur: NEGATIVE mg/dL
Specific Gravity, Urine: >=1.03 (ref 1.005–1.030)
pH: 5.5 (ref 5.0–8.0)

## 2021-05-28 MED ORDER — DICYCLOMINE HCL 20 MG PO TABS: 20.0000 mg | ORAL_TABLET | Freq: Two times a day (BID) | ORAL | 0 refills | Status: AC

## 2021-05-28 MED ORDER — ALUM & MAG HYDROXIDE-SIMETH 200-200-20 MG/5ML PO SUSP
30.0000 mL | Freq: Once | ORAL | Status: AC
  Administered 2021-05-28: 30 mL via ORAL
  Filled 2021-05-28: qty 30

## 2021-05-28 MED ORDER — KETOROLAC TROMETHAMINE 60 MG/2ML IM SOLN
30.0000 mg | Freq: Once | INTRAMUSCULAR | Status: AC
  Administered 2021-05-28: 30 mg via INTRAMUSCULAR
  Filled 2021-05-28: qty 2

## 2021-05-28 MED ORDER — DICYCLOMINE HCL 10 MG PO CAPS
10.0000 mg | ORAL_CAPSULE | Freq: Once | ORAL | Status: AC
  Administered 2021-05-28: 10 mg via ORAL
  Filled 2021-05-28: qty 1

## 2021-05-28 NOTE — ED Triage Notes (Signed)
Pt states pain in right side below ribs. Started last summer and comes and goes. Worst it's has ever been tonight and unable to sleep.

## 2021-05-28 NOTE — ED Provider Notes (Signed)
[facility] EMERGENCY DEPARTMENT Provider Note   CSN: [id number] Arrival date & time: 05/28/21  0242     History Chief Complaint  Patient presents with   Flank Pain    Melanie Bush is a 55 y.o. female.  The history is provided by the patient.  Flank Pain This is a recurrent problem. The current episode started yesterday. The problem occurs constantly. The problem has not changed since onset.Pertinent negatives include no chest pain, no abdominal pain, no headaches and no shortness of breath. Nothing aggravates the symptoms. Nothing relieves the symptoms. She has tried nothing for the symptoms. The treatment provided no relief.  5 days of flank pain that is recurrent since eating 2 greasy hamburgers.  Patient has associated diarrhea 4 times Sunday to Monday but continues to eat greasy food and have loos stools.  No urinary complaints.  No n/v.      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   Bigeminy 04/27/2018   Essential hypertension 04/27/2018   Chest tightness 01/31/2018   Cigarette smoker 01/31/2018   Palpitations 10/25/2017   Chronic pain of toe of right foot 10/25/2017   History of toe fracture 10/25/2017   Condyloma acuminatum of vulva 11/28/2015   History of hysterectomy 09/16/2015   Encounter for gynecological examination without abnormal finding 08/26/2015   Endometriosis 08/26/2015   Menopausal and female climacteric states 08/26/2015    Past Surgical History:  Procedure Laterality Date   ABDOMINAL HYSTERECTOMY     APPENDECTOMY       OB History   No obstetric history on file.     Family History  Problem Relation Age of Onset   Healthy Mother     Social History   Tobacco Use   Smoking status: Every Day   Smokeless tobacco: Never  Vaping Use   Vaping Use: Never used  Substance Use Topics   Alcohol use: Yes    Alcohol/week: 1.0 - 2.0 standard drink    Types: 1 - 2 Glasses of wine per week    Comment: daily   Drug use: Not Currently    Home Medications Prior to Admission medications   Medication Sig Start Date End Date Taking? Authorizing Provider  acebutolol (SECTRAL) 400 MG capsule TAKE 1 CAPSULE BY MOUTH EVERY DAY 03/17/21   Revankar, Aundra Dubin, MD  albuterol (VENTOLIN HFA) 108 (90 Base) MCG/ACT inhaler Inhale 2 puffs into the  lungs every 4 (four) hours as needed for wheezing or shortness of breath. 03/11/20   [provider]  amLODipine (NORVASC) 5 MG tablet Take 5 mg by mouth daily. 07/26/20   [provider]  amoxicillin (AMOXIL) 500 MG capsule Take 500 mg by mouth every 6 (six) hours. 01/08/21   [provider]  diltiazem (CARDIZEM) 30 MG tablet Take 30 mg by mouth as needed for other. palpitations    [provider]  estradiol (ESTRACE) 0.5 MG tablet Take 0.5 mg  by mouth daily. 01/17/20   [provider]    Allergies    Amoxicillin and Penicillin g  Review of Systems   Review of Systems  Constitutional:  Negative for fever.  HENT:  Negative for facial swelling.   Eyes:  Negative for redness.  Respiratory:  Negative for shortness of breath.   Cardiovascular:  Negative for chest pain.  Gastrointestinal:  Positive for diarrhea. Negative for abdominal pain and vomiting.  Genitourinary:  Positive for flank pain. Negative for dysuria.  Musculoskeletal:  Negative for neck stiffness.  Skin:  Negative for rash.  Neurological:  Negative for headaches.  Psychiatric/Behavioral:  Negative for agitation.   All other systems reviewed and are negative.  Physical Exam Updated Vital Signs BP (!) 138/97 (BP Location: Right Arm)   Pulse 87   Temp 98.4 F (36.9 C) (Oral)   Resp 20   Ht  (1.676 m)   Wt 55.8 kg   SpO2 100%   BMI 19.85 kg/m   Physical Exam Vitals and nursing note reviewed.  Constitutional:      General: She is not in acute distress.    Appearance: Normal appearance.  HENT:     Head: Normocephalic and atraumatic.     Nose: Nose normal.  Eyes:     Conjunctiva/sclera: Conjunctivae normal.     Pupils: Pupils are equal, round, and reactive to light.  Cardiovascular:     Rate and Rhythm: Normal rate and regular rhythm.     Pulses: Normal pulses.     Heart sounds: Normal heart sounds.  Pulmonary:     Effort: Pulmonary effort is normal.     Breath sounds: Normal breath sounds.  Abdominal:     General: Abdomen is flat.     Palpations: Abdomen is soft.     Tenderness: There is no abdominal tenderness. There is no guarding or rebound. Negative signs include Murphy's sign, Rovsing's sign and McBurney's sign.       Comments: Gassy throughout   Musculoskeletal:        General: Normal range of motion.     Cervical back: Normal range of motion and neck supple.  Skin:    General: Skin is warm and dry.     Capillary  Refill: Capillary refill takes less than 2 seconds.  Neurological:     General: No focal deficit present.     Mental Status: She is alert and oriented to person, place, and time.     Deep Tendon Reflexes: Reflexes normal.  Psychiatric:        Mood and Affect: Mood normal.        Behavior: Behavior normal.    ED Results / Procedures / Treatments   Labs (all labs ordered are listed, but only abnormal results are displayed) Results for orders placed or performed during the hospital encounter of 05/28/21  Urinalysis, Routine w reflex microscopic Urine, Clean Catch  Result Value Ref Range   Color, Urine AMBER (A)  YELLOW   APPearance CLEAR CLEAR   Specific Gravity, Urine >=1.030 1.005 - 1.030   pH 5.5 5.0 - 8.0   Glucose, UA NEGATIVE NEGATIVE mg/dL   Hgb urine dipstick NEGATIVE NEGATIVE   Bilirubin Urine MODERATE (A) NEGATIVE   Ketones, ur NEGATIVE NEGATIVE mg/dL   Protein, ur NEGATIVE NEGATIVE mg/dL   Nitrite NEGATIVE NEGATIVE   Leukocytes,Ua NEGATIVE NEGATIVE   CT Renal Stone Study  Result Date: 05/28/2021 CLINICAL DATA:  Right flank pain. EXAM: CT ABDOMEN AND PELVIS WITHOUT CONTRAST TECHNIQUE: Multidetector CT imaging of the abdomen and pelvis was performed following the standard protocol without IV contrast. COMPARISON:  None. FINDINGS: Lower chest: No acute abnormality. Hepatobiliary: No focal liver abnormality is seen. No gallstones, gallbladder wall thickening, or biliary dilatation. Pancreas: Unremarkable. No pancreatic ductal dilatation or surrounding inflammatory changes. Spleen: Normal in size without focal abnormality. Adrenals/Urinary Tract: Adrenal glands are unremarkable. Kidneys are normal, without renal calculi, focal lesion, or hydronephrosis. The urinary bladder is empty and subsequently limited in evaluation. Stomach/Bowel: Stomach is within normal limits. The appendix is surgically absent. No evidence of bowel wall thickening, distention, or inflammatory changes.  Vascular/Lymphatic: Aortic atherosclerosis. No enlarged abdominal or pelvic lymph nodes. Reproductive: Status post hysterectomy. No adnexal masses. Other: No abdominal wall hernia or abnormality. No abdominopelvic ascites. Musculoskeletal: No acute or significant osseous findings. IMPRESSION: 1. No acute or active process within the abdomen or pelvis. 2. Aortic atherosclerosis. Aortic Atherosclerosis (ICD10-I70.0). Electronically Signed   By: Aram Candela M.D.   On: 05/28/2021 04:08     Radiology No results found.  Procedures Procedures   Medications Ordered in ED Medications  ketorolac (TORADOL) injection 30 mg (has no administration in time range)  dicyclomine (BENTYL) capsule 10 mg (has no administration in time range)  alum & mag hydroxide-simeth (MAALOX/MYLANTA) 200-200-20 MG/5ML suspension 30 mL (30 mLs Oral Given 05/28/21 0421)    ED Course  I have reviewed the triage vital signs and the nursing notes.  Pertinent labs & imaging results that were available during my care of the patient were reviewed by me and considered in my medical decision making (see chart for details).    MDM Rules/Calculators/A&P                            I suspect this is bowel spasm related to the diarrhea. Patient is very well appearing. Bland diet instructions given.  Patient is encouraged to start a probiotics QID x 7 days.    Alantis Bethune was evaluated in Emergency Department on 05/28/2021 for the symptoms described in the history of present illness. She was evaluated in the context of the global COVID-19 pandemic, which necessitated consideration that the patient might be at risk for infection with the SARS-CoV-2 virus that causes COVID-19. Institutional protocols and algorithms that pertain to the evaluation of patients at risk for COVID-19 are in a state of rapid change based on information released by regulatory bodies including the CDC and federal and state organizations. These policies and  algorithms were followed during the patient's care in the ED.  Final Clinical Impression(s) / ED Diagnoses Final diagnoses:  None   Return for intractable cough, coughing up blood, fevers > 100.4 unrelieved by medication, shortness of breath, intractable vomiting, chest pain, shortness of breath, weakness, numbness, changes in speech, facial asymmetry, abdominal pain, passing out, Inability to tolerate liquids or food, cough, altered mental status or any concerns. No signs of systemic illness  or infection. The patient is nontoxic-appearing on exam and vital signs are within normal limits.  I have reviewed the triage vital signs and the nursing notes. Pertinent labs & imaging results that were available during my care of the patient were reviewed by me and considered in my medical decision making (see chart for details). After history, exam, and  Rx / DC Orders ED Discharge Orders     None        Savi Lastinger, MD 05/28/21 1517

## 2021-07-11 ENCOUNTER — Ambulatory Visit (INDEPENDENT_AMBULATORY_CARE_PROVIDER_SITE_OTHER): Payer: 59 | Admitting: Cardiology

## 2021-07-11 ENCOUNTER — Other Ambulatory Visit: Payer: Self-pay

## 2021-07-11 ENCOUNTER — Encounter: Payer: Self-pay | Admitting: Cardiology

## 2021-07-11 VITALS — BP 136/82 | HR 82 | Ht 66.6 in | Wt 131.0 lb

## 2021-07-11 DIAGNOSIS — I493 Ventricular premature depolarization: Secondary | ICD-10-CM | POA: Diagnosis not present

## 2021-07-11 DIAGNOSIS — F17210 Nicotine dependence, cigarettes, uncomplicated: Secondary | ICD-10-CM | POA: Diagnosis not present

## 2021-07-11 DIAGNOSIS — I10 Essential (primary) hypertension: Secondary | ICD-10-CM | POA: Diagnosis not present

## 2021-07-11 NOTE — Patient Instructions (Signed)

## 2021-07-11 NOTE — Progress Notes (Signed)
Cardiology Office Note:    Date:  07/11/2021   ID:  Melanie Bush, DOB 1966-01-18, MRN [id number]  PCP:  Jamal Collin, PA-C  Cardiologist:  Garwin Brothers, MD   Referring MD: Jamal Collin, PA-C    ASSESSMENT:    1. Essential hypertension   2. Frequent PVCs   3. Cigarette smoker    PLAN:    In order of problems listed above:  Primary prevention stressed with patient.  Importance of compliance with diet medication stressed and she vocalized understanding.  She was advised to walk at least half an hour a day 5 days a week.  She promises to do so. Essential hypertension: Blood pressure stable and diet was emphasized.  Lifestyle modification urged.  Salt intake issues were discussed with Frequent PVCs: These have resolved and patient has no symptoms at this point.  She is happy about it. Cigarette smoker: I spent 5 minutes with the patient discussing solely about smoking. Smoking cessation was counseled. I suggested to the patient also different medications and pharmacological interventions. Patient is keen to try stopping on its own at this time. He will get back to me if he needs any further assistance in this matter. Patient will have blood work with primary care in the next few days and will send [place] a copy.  We will advise her accordingly. Patient will be seen in follow-up appointment in 9 months or earlier if the patient has any concerns    Medication Adjustments/Labs and Tests Ordered: Current medicines are reviewed at length with the patient today.  Concerns regarding medicines are outlined above.  No orders of the defined types were placed in this encounter.  No orders of the defined types were placed in this encounter.    No chief complaint on file.    History of Present Illness:    Melanie Bush is a 56 y.o. female.  Patient has past medical history of essential hypertension, mixed dyslipidemia and cigarette smoking.  She has history of frequent PVCs in the  past.  These have resolved now.  She denies any chest pain orthopnea or PND.  She is happy.  She works in a full-time so does not get much time for exercise.  No chest pain orthopnea or PND.  At the time of my evaluation, the patient is alert awake oriented and in no distress.  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 endometriosis   Menopausal and female climacteric states 08/26/2015   Palpitation    Palpitations 10/25/2017   PVC's (premature ventricular contractions) 05/09/2018   Raynaud's disease without gangrene 07/26/2020    Past Surgical History:  Procedure Laterality Date   ABDOMINAL HYSTERECTOMY     APPENDECTOMY      Current Medications: Current Meds  Medication Sig   acebutolol (SECTRAL) 400 MG capsule TAKE 1 CAPSULE BY MOUTH EVERY DAY  albuterol (VENTOLIN HFA) 108 (90 Base) MCG/ACT inhaler Inhale 2 puffs into the lungs every 4 (four) hours as needed for wheezing or shortness of breath.   amLODipine (NORVASC) 5 MG tablet Take 5 mg by mouth daily.   amoxicillin (AMOXIL) 500 MG capsule Take 500 mg by mouth every 6 (six) hours.   Azelastine HCl 137 MCG/SPRAY SOLN Place 1 spray into both nostrils 2 (two) times daily.   dicyclomine (BENTYL) 20 MG tablet Take 1 tablet (20 mg total) by mouth 2 (two) times daily.   diltiazem (CARDIZEM) 30 MG  tablet Take 30 mg by mouth as needed for other. palpitations   estradiol (ESTRACE) 0.5 MG tablet Take 0.5 mg by mouth daily.   fluticasone (FLONASE) 50 MCG/ACT nasal spray Place 1 spray into both nostrils daily.     Allergies:   Amoxicillin and Penicillin g   Social History   Socioeconomic History   Marital status: Single    Spouse name: Not on file   Number of children: Not on file   Years of education: Not on file   Highest education level: Not on file  Occupational History   Not on file  Tobacco Use   Smoking status: Every Day   Smokeless tobacco: Never  Vaping Use   Vaping Use: Never used  Substance and Sexual Activity   Alcohol use: Yes    Alcohol/week: 1.0 - 2.0 standard drink    Types: 1 - 2 Glasses of wine per week    Comment: daily   Drug use: Not Currently   Sexual activity: Not on file  Other Topics Concern   Not on file  Social History Narrative   Not on file   Social Determinants of Health   Financial Resource Strain: Not on file  Food Insecurity: Not on file  Transportation Needs: Not on file  Physical Activity: Not on file  Stress: Not on file  Social Connections: Not on file     Family History: The patient's family history includes Healthy in her mother. There is no history of Hypertension, Heart disease, Diabetes, or Cancer.  ROS:   Please see the history of present illness.    All other systems reviewed and are negative.  EKGs/Labs/Other Studies Reviewed:    The following studies were reviewed today: I discussed my findings with the patient at length   Recent Labs: No results found for requested labs within last 8760 hours.  Recent Lipid Panel    Component Value Date/Time   CHOL 173 01/13/2019 0815   TRIG 97 01/13/2019 0815   HDL 86 01/13/2019 0815   CHOLHDL 2.0 01/13/2019 0815   LDLCALC 68 01/13/2019 0815    Physical Exam:    VS:  BP 136/82    Pulse 82    Ht 5' 6.6" (1.692 m)    Wt 131 lb 0.6 oz (59.4 kg)    SpO2 98%    BMI  20.77 kg/m     Wt Readings from Last 3 Encounters:  07/11/21 131 lb 0.6 oz (59.4 kg)  05/28/21 123 lb (55.8 kg)  01/14/21 128 lb 0.6 oz (58.1 kg)     GEN: Patient is in no acute distress HEENT: Normal NECK: No JVD; No carotid bruits LYMPHATICS: No lymphadenopathy CARDIAC: Hear sounds regular, 2/6 systolic murmur at the apex. RESPIRATORY:  Clear to auscultation without rales, wheezing or rhonchi  ABDOMEN: Soft, non-tender, non-distended MUSCULOSKELETAL:  No edema; No deformity  SKIN: Warm and dry NEUROLOGIC:  Alert and oriented x  3 PSYCHIATRIC:  Normal affect   Signed, Jenean Lindau, MD  07/11/2021 11:18 AM    [place]

## 2021-08-12 DIAGNOSIS — H903 Sensorineural hearing loss, bilateral: Secondary | ICD-10-CM

## 2021-08-12 HISTORY — DX: Sensorineural hearing loss, bilateral: H90.3

## 2021-11-26 DIAGNOSIS — R42 Dizziness and giddiness: Secondary | ICD-10-CM

## 2021-11-26 DIAGNOSIS — G379 Demyelinating disease of central nervous system, unspecified: Secondary | ICD-10-CM

## 2021-11-26 DIAGNOSIS — I679 Cerebrovascular disease, unspecified: Secondary | ICD-10-CM | POA: Insufficient documentation

## 2021-11-26 HISTORY — DX: Dizziness and giddiness: R42

## 2021-11-26 HISTORY — DX: Demyelinating disease of central nervous system, unspecified: G37.9

## 2021-11-26 HISTORY — DX: Cerebrovascular disease, unspecified: I67.9

## 2021-12-15 ENCOUNTER — Other Ambulatory Visit: Payer: Self-pay

## 2021-12-15 ENCOUNTER — Other Ambulatory Visit: Payer: Self-pay | Admitting: Cardiology

## 2021-12-15 MED ORDER — ACEBUTOLOL HCL 400 MG PO CAPS: ORAL_CAPSULE | ORAL | 2 refills | Status: DC

## 2022-01-05 IMAGING — CT CT CARDIAC CORONARY ARTERY CALCIUM SCORE
3 series · 14 of 20 positions shown, 15 images · non-contrast
Comparison: None.
COMPARISON: None.

Addendum:
EXAM:
OVER-READ INTERPRETATION  CT CHEST

The following report is an over-read performed by radiologist Dr.
Iniciatyvu Julija [REDACTED] on 08/12/2020. This
over-read does not include interpretation of cardiac or coronary
anatomy or pathology. The coronary calcium score interpretation by
the cardiologist is attached.
CLINICAL DATA: Risk stratification
Coronary Calcium Score
TECHNIQUE: The patient was scanned on a Siemens Force scanner. Axial
non-contrast 3 mm slices were carried out through the heart. The
data set was analyzed on a dedicated work station and scored using
the Agatson method.

[Series 2: casc 3.0 bv41 2 bestdiast 69 % · axial · 0.37mm/px · z∈[-250,-156]mm · 4 of 53 slices shown, 5 images]
[im 11/53  vessel]
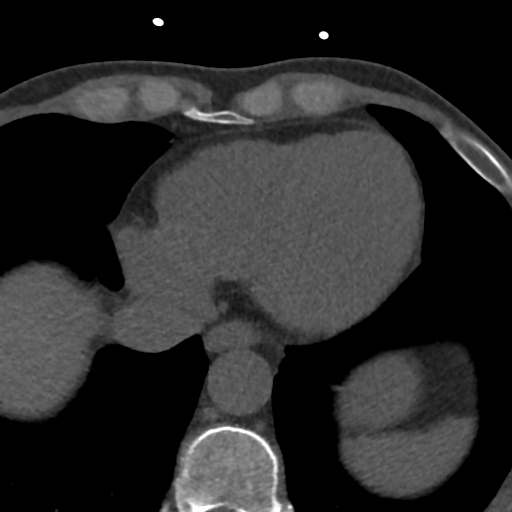
[im 11/53  lung]
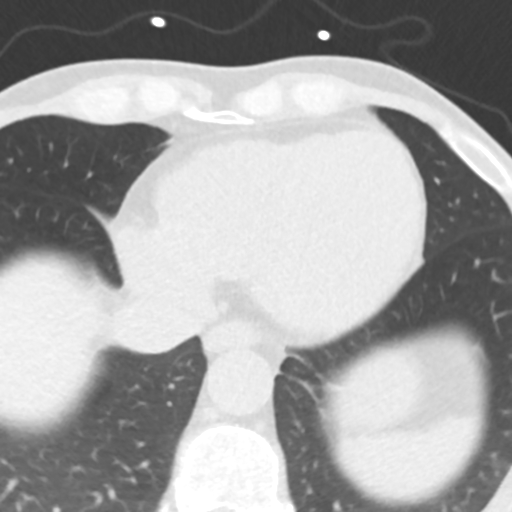
[im 21/53  vessel]
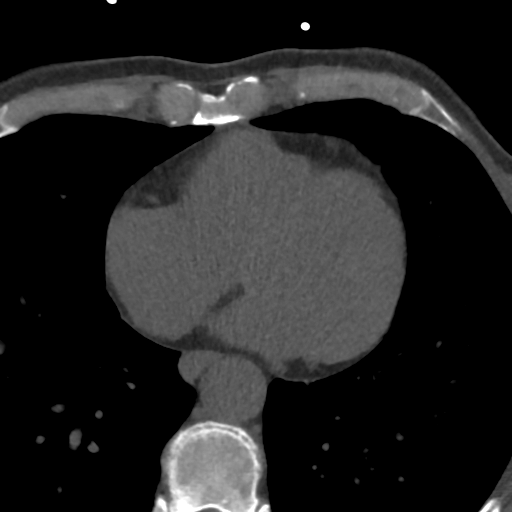
[im 32/53  vessel]
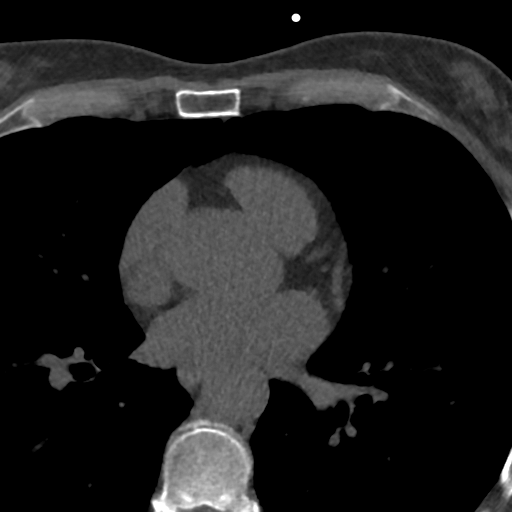
[im 42/53  vessel]
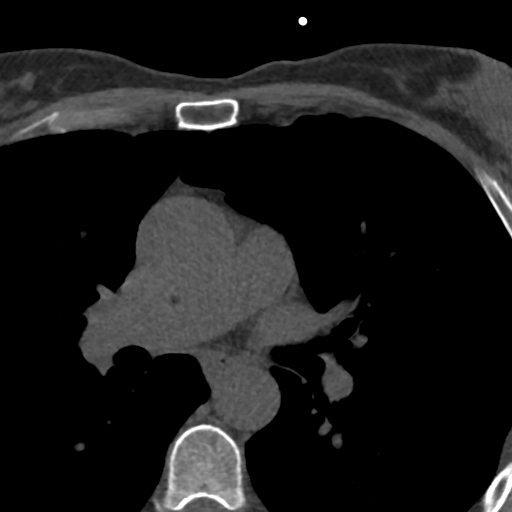

[Series 3: lung 67 % · axial · 0.61mm/px · z∈[-256,-150]mm · 5 of 53 slices shown]
[im 9/53  lung]
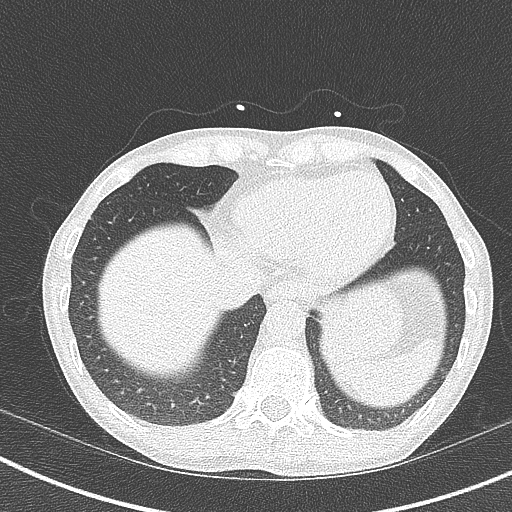
[im 18/53  lung]
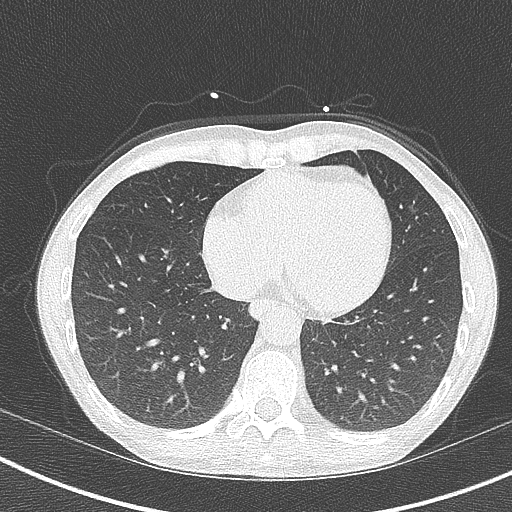
[im 27/53  lung]
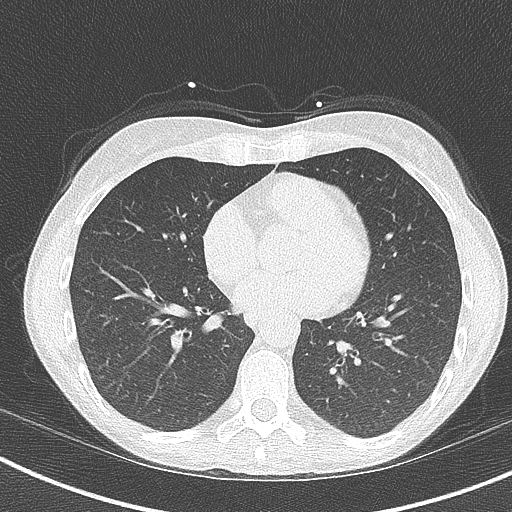
[im 35/53  lung]
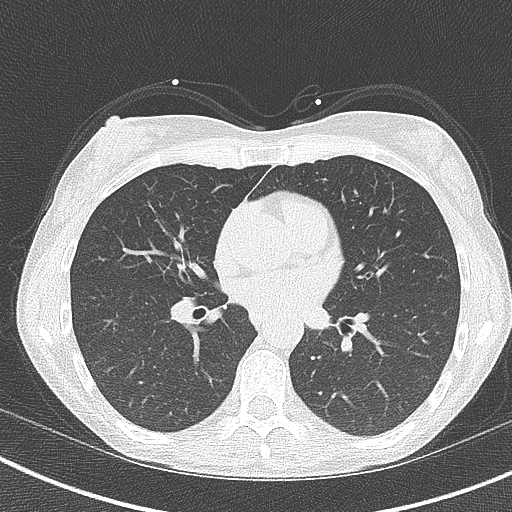
[im 44/53  lung]
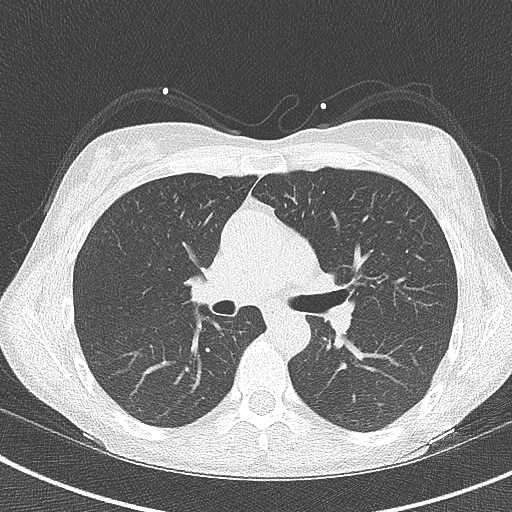

[Series 4: lung st 67 % · axial · 0.61mm/px · z∈[-256,-150]mm · 5 of 53 slices shown]
[im 9/53  lung]
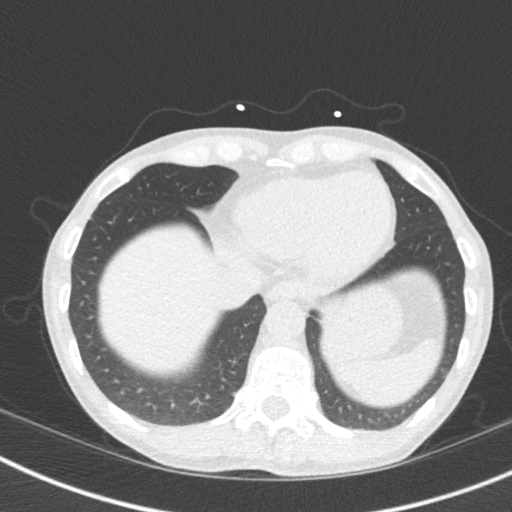
[im 18/53  lung]
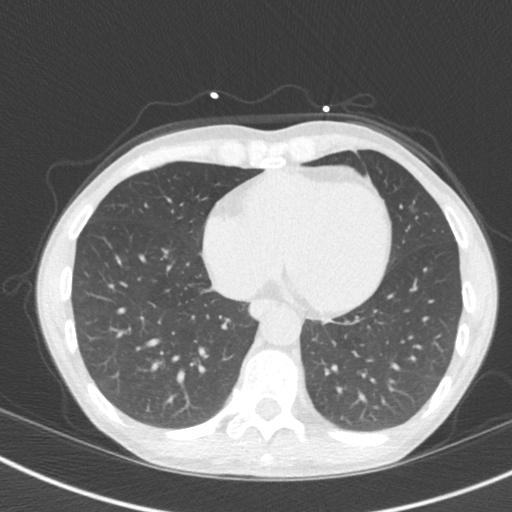
[im 27/53  lung]
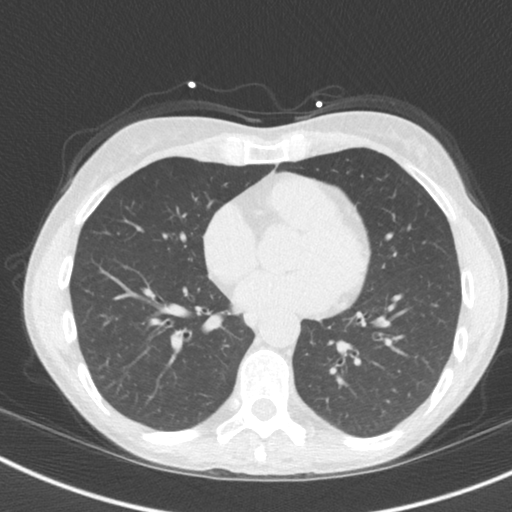
[im 35/53  lung]
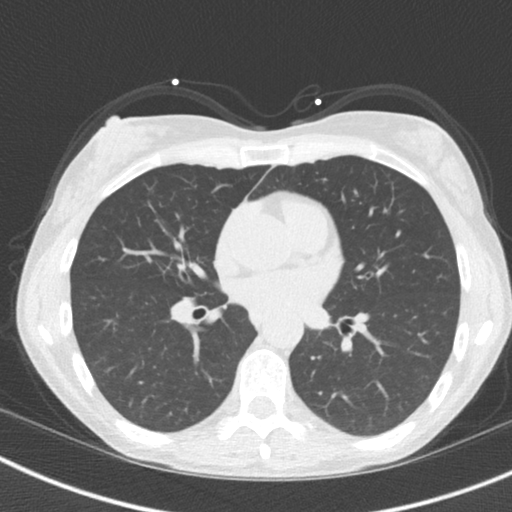
[im 44/53  lung]
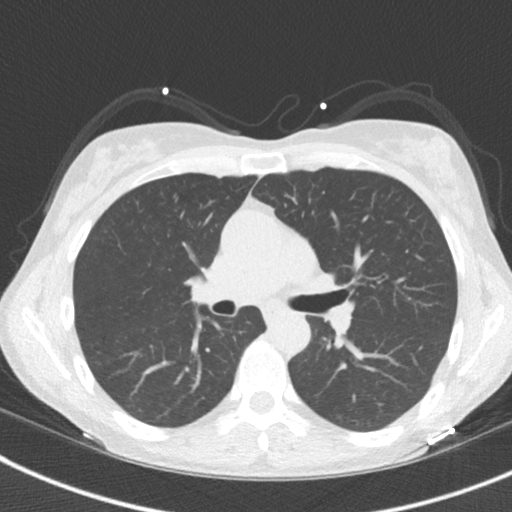

[14 of 20 positions shown; findings below may reference images not displayed]

FINDINGS: Within the visualized portions of the thorax there are no suspicious
appearing pulmonary nodules or masses, there is no acute
consolidative airspace disease, no pleural effusions, no
pneumothorax and no lymphadenopathy. Visualized portions of the
upper abdomen are unremarkable. There are no aggressive appearing
lytic or blastic lesions noted in the visualized portions of the
skeleton.
IMPRESSION: 1. No significant incidental noncardiac findings are noted.
FINDINGS: Non-cardiac: See separate report from [REDACTED].

Ascending Aorta: Normal caliber. No calcifications.

Pericardium: Normal

Coronary arteries: Normal coronary origins.
IMPRESSION: Coronary calcium score of 0. This was 0 percentile for age and sex
matched control.

M Irfan Toki

*** End of Addendum ***
EXAM:
OVER-READ INTERPRETATION  CT CHEST

The following report is an over-read performed by radiologist Dr.
Iniciatyvu Julija [REDACTED] on 08/12/2020. This
over-read does not include interpretation of cardiac or coronary
anatomy or pathology. The coronary calcium score interpretation by
the cardiologist is attached.
FINDINGS: Within the visualized portions of the thorax there are no suspicious
appearing pulmonary nodules or masses, there is no acute
consolidative airspace disease, no pleural effusions, no
pneumothorax and no lymphadenopathy. Visualized portions of the
upper abdomen are unremarkable. There are no aggressive appearing
lytic or blastic lesions noted in the visualized portions of the
skeleton.
IMPRESSION: 1. No significant incidental noncardiac findings are noted.

## 2022-04-08 DIAGNOSIS — R52 Pain, unspecified: Secondary | ICD-10-CM | POA: Insufficient documentation

## 2022-04-08 HISTORY — DX: Pain, unspecified: R52

## 2022-04-22 DIAGNOSIS — R55 Syncope and collapse: Secondary | ICD-10-CM | POA: Insufficient documentation

## 2022-04-22 DIAGNOSIS — R42 Dizziness and giddiness: Secondary | ICD-10-CM

## 2022-04-22 HISTORY — DX: Syncope and collapse: R55

## 2022-04-22 HISTORY — DX: Dizziness and giddiness: R42

## 2022-04-30 ENCOUNTER — Encounter: Payer: Self-pay | Admitting: Cardiology

## 2022-04-30 ENCOUNTER — Ambulatory Visit: Payer: 59 | Admitting: Cardiology

## 2022-04-30 ENCOUNTER — Ambulatory Visit: Payer: BC Managed Care – PPO | Attending: Cardiology | Admitting: Cardiology

## 2022-04-30 VITALS — BP 118/74 | HR 69 | Ht 66.6 in | Wt 130.0 lb

## 2022-04-30 DIAGNOSIS — R002 Palpitations: Secondary | ICD-10-CM

## 2022-04-30 DIAGNOSIS — I493 Ventricular premature depolarization: Secondary | ICD-10-CM

## 2022-04-30 DIAGNOSIS — I10 Essential (primary) hypertension: Secondary | ICD-10-CM

## 2022-04-30 DIAGNOSIS — F17210 Nicotine dependence, cigarettes, uncomplicated: Secondary | ICD-10-CM

## 2022-04-30 NOTE — Patient Instructions (Addendum)
FDA-cleared personal EKG: The world's most clinically validated personal EKG, FDA-cleared to detect Atrial Fibrillation, Bradycardia, and Tachycardia. Melanie Bush is the most reliable way to check in on your heart from home. Take your EKG from anywhere: Capture a medical-grade EKG in 30 seconds and get an instant analysis right on your smartphone. Melanie Bush is small enough to fit in your pocket, so you can take it with you anywhere. Easy to use: Simply place your fingers on the sensors--no wires, patches, or gels. Recommended by doctors: A trusted resource, Melanie Bush is the #1 doctor-recommended personal EKG with more than 100 million EKGs recorded. Save or share your EKGs: With the press of a button, email your EKGs to your doctor or save them on your phone. Works with smartphones: Compatible with [employer] and tablets. Check our compatibility chart. FSA/HSA eligible: Purchase using an FSA or HSA account (please confirm coverage with your insurance provider). Phone clip included with purchase, a $15 value. Conveniently take your device with you wherever you go.  [email or web address]://[email or web address].tn   Step One- Record your EKG strip on [facility] app.   Step two- On [facility] EKG click "Download"   Step three- It will prompt you to make a password for this EKG. Please make the password "Bush" so that we can view it.   Step four- Click on the little "upload" button (small box with an arrow in the middle) in the bottom left-hand corner of the screen.   Step five- Click "Save to Files"  Step six- Click on "On my iphone" and then "Pages" then press save in the top right-hand corner.   NOW GO TO MYCHART   Once on MyChart click "Messages"  Step one- Click "Send a message"  Step two- Click "Ask a medical question"   Step three- Click "Non urgent medical question"   Step four- Click on Melanie Bush's name.  Step five- Click on the small  paperclip at the bottom of the screen  Step six- Click "Choose file"  Step seven- Pick the most recent EKG strip listed.   Once uploaded send the message!  Medication Instructions:  Your physician recommends that you continue on your current medications as directed. Please refer to the Current Medication list given to you today.  *If you need a refill on your cardiac medications before your next appointment, please call your pharmacy*   Lab Work: Your physician recommends that you have labs done in the office today. Your test included  basic metabolic panel, complete blood count, TSH and vitamin B12.  [address] in [place]. They also close daily for lunch for 12-1.   [address] 2nd floor M-W 8-11:30 and 1-4:30 and Thursday and Friday 8-11:30.   If you have labs (blood work) drawn today and your tests are completely normal, you will receive your results only by: [place] (if you have MyChart) OR A paper copy in the mail If you have any lab test that is abnormal or we need to change your treatment, we will call you to review the results.   Testing/Procedures: None ordered   Follow-Up: At [facility], you and your health needs are our priority.  As part of our continuing mission to provide you with exceptional heart care, we have created designated Provider Care Teams.  These Care Teams include your primary Cardiologist (physician) and Advanced Practice Providers (APPs -  Physician Assistants and Nurse Practitioners) who all work together to provide you  with the care you need, when you need it.  We recommend signing up for the patient portal called "MyChart".  Sign up information is provided on this After Visit Summary.  MyChart is used to connect with patients for Virtual Visits (Telemedicine).  Patients are able to view lab/test results, encounter notes, upcoming appointments, etc.  Non-urgent messages can be sent to your  provider as well.   To learn more about what you can do with MyChart, go to [email or web address].    Your next appointment:   6 month(s)  The format for your next appointment:   In Person  Provider:   Belva Crome, MD   Other Instructions NA

## 2022-04-30 NOTE — Progress Notes (Signed)
Cardiology Office Note:    Date:  04/30/2022   ID:  Melanie Bush, DOB 1966-04-27, MRN [id number]  PCP:  Ardith Dark, PA-C  Cardiologist:  Jenean Lindau, MD   Referring MD: Ardith Dark, PA-C    ASSESSMENT:    1. Essential hypertension   2. Frequent PVCs   3. Palpitation   4. Palpitations    PLAN:    In order of problems listed above:  Primary prevention stressed with the patient.  Importance of compliance with diet medication stressed and she vocalized understanding.  She was advised to walk at least half an hour a day 5 days a week and she promises to do so. Cigarette smoker: I spent 5 minutes with the patient discussing solely about smoking. Smoking cessation was counseled. I suggested to the patient also different medications and pharmacological interventions. Patient is keen to try stopping on its own at this time. He will get back to me if he needs any further assistance in this matter. Palpitations: These are very sporadic.  At the suggested monitoring but she is not keen.  She will do a cardiac self monitoring and I gave her the details.  Also we will do blood work Chem-7 CBC and vitamin B12 in view of deficiency history. PVCs: Stable at this time.  Self monitoring will help.  She will keep [place] posted on the records of her self-monitoring. Patient will be seen in follow-up appointment in 12 months or earlier if the patient has any concerns    Medication Adjustments/Labs and Tests Ordered: Current medicines are reviewed at length with the patient today.  Concerns regarding medicines are outlined above.  No orders of the defined types were placed in this encounter.  No orders of the defined types were placed in this encounter.    No chief complaint on file.    History of Present Illness:    Melanie Bush is a 56 y.o. female.  Patient has past medical history of essential hypertension PVCs.  She gives history of palpitations.  She tells me that in her  lifetime she has had 2 significant episodes once several years ago and one a couple of weeks ago.  They are very sporadic.  No chest pain orthopnea or PND.  She has cut down her coffee smoking significantly.  At the time of my evaluation, the patient is alert awake oriented and in no distress.  She denies any chest pain  Past Medical History:  Diagnosis Date   Bigeminy 04/27/2018   Treated cardiology [place] of this note might be different from the original. Treated cardiology [place]   Body aches 04/08/2022   Last Assessment & Plan:  Formatting of this note might be different from the original. Acute upper respiratory illness, suspected to be viral. Advised she present for repeating testing for Covid-19, as well as an influenza swab. Also advised that her illness is likely viral. 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 endometriosis   Menopausal and female climacteric states 08/26/2015   Near syncope 04/22/2022   Palpitation    Palpitations 10/25/2017   PVC's (premature ventricular contractions) 05/09/2018   Raynaud's disease without gangrene 07/26/2020   Sensorineural hearing loss (SNHL) of both ears 08/12/2021   Vertigo 11/26/2021    Past Surgical History:  Procedure Laterality Date   ABDOMINAL  HYSTERECTOMY     APPENDECTOMY      Current Medications: Current Meds  Medication Sig   acebutolol (SECTRAL) 400 MG capsule TAKE 1 CAPSULE BY MOUTH EVERY DAY   amLODipine (NORVASC) 5 MG tablet Take 5 mg by mouth daily.   busPIRone (BUSPAR) 5 MG tablet Take 5 mg by mouth as needed (anxiety).   diltiazem (CARDIZEM) 30 MG tablet Take 30 mg by mouth as needed for other. palpitations   estradiol (ESTRACE) 0.5 MG tablet Take 0.5 mg by mouth daily.     Allergies:   Amoxicillin and Penicillin g   Social History   Socioeconomic History   Marital status: Single    Spouse name: Not on file   Number of children: Not on file   Years of education: Not on file   Highest education level: Not on file  Occupational History   Not on file  Tobacco Use   Smoking status: Every Day   Smokeless tobacco: Never  Vaping Use   Vaping Use: Never used  Substance and Sexual Activity   Alcohol use: Yes    Alcohol/week: 1.0 - 2.0 standard drink of alcohol    Types: 1 - 2 Glasses of wine per week    Comment: daily   Drug use: Not Currently   Sexual activity: Not on file  Other Topics Concern   Not on file  Social History Narrative   Not on file   Social Determinants of Health   Financial Resource Strain: Not on file  Food Insecurity: Not on file  Transportation Needs: Not on file  Physical Activity: Not on file  Stress: Not on file  Social Connections: Not on file     Family History: The patient's family history includes Healthy in her mother. There is no history of Hypertension, Heart disease, Diabetes, or Cancer.  ROS:   Please see the history of present illness.    All other systems reviewed and are negative.  EKGs/Labs/Other Studies Reviewed:    The following studies were reviewed today: EKG reveals sinus rhythm and was within normal limits   Recent Labs: No results found for requested labs within last 365 days.  Recent Lipid Panel    Component Value Date/Time   CHOL 173  01/13/2019 0815   TRIG 97 01/13/2019 0815   HDL 86 01/13/2019 0815   CHOLHDL 2.0 01/13/2019 0815   LDLCALC 68 01/13/2019 0815    Physical Exam:    VS:  BP 118/74   Pulse 69   Ht 5' 6.6" (1.692 m)   Wt 130 lb (59 kg)   SpO2 96%   BMI 20.61 kg/m     Wt Readings from Last 3 Encounters:  04/30/22 130 lb (59 kg)  07/11/21 131 lb 0.6 oz (59.4 kg)  05/28/21 123 lb (55.8 kg)     GEN: Patient is in no acute distress HEENT: Normal NECK: No JVD; No carotid bruits LYMPHATICS: No lymphadenopathy CARDIAC: Hear sounds regular, 2/6 systolic murmur at the apex. RESPIRATORY:  Clear to auscultation  without rales, wheezing or rhonchi  ABDOMEN: Soft, non-tender, non-distended MUSCULOSKELETAL:  No edema; No deformity  SKIN: Warm and dry NEUROLOGIC:  Alert and oriented x 3 PSYCHIATRIC:  Normal affect   Signed, Garwin Brothers, MD  04/30/2022 9:35 AM    [facility]

## 2022-05-01 LAB — CBC
Hematocrit: 37.9 % (ref 34.0–46.6)
Hemoglobin: 13 g/dL (ref 11.1–15.9)
MCH: 34.8 pg — ABNORMAL HIGH (ref 26.6–33.0)
MCHC: 34.3 g/dL (ref 31.5–35.7)
MCV: 101 fL — ABNORMAL HIGH (ref 79–97)
Platelets: 203 10*3/uL (ref 150–450)
RBC: 3.74 x10E6/uL — ABNORMAL LOW (ref 3.77–5.28)
RDW: 11 % — ABNORMAL LOW (ref 11.7–15.4)
WBC: 5.3 10*3/uL (ref 3.4–10.8)

## 2022-05-01 LAB — BASIC METABOLIC PANEL
BUN/Creatinine Ratio: 19 (ref 9–23)
BUN: 12 mg/dL (ref 6–24)
CO2: 23 mmol/L (ref 20–29)
Calcium: 9.5 mg/dL (ref 8.7–10.2)
Chloride: 102 mmol/L (ref 96–106)
Creatinine, Ser: 0.64 mg/dL (ref 0.57–1.00)
Glucose: 89 mg/dL (ref 70–99)
Potassium: 4.7 mmol/L (ref 3.5–5.2)
Sodium: 139 mmol/L (ref 134–144)
eGFR: 103 mL/min/{1.73_m2} (ref >59)

## 2022-05-01 LAB — TSH: TSH: 1.05 u[IU]/mL (ref 0.450–4.500)

## 2022-05-01 LAB — VITAMIN B12: Vitamin B-12: 339 pg/mL (ref 232–1245)

## 2022-08-12 DIAGNOSIS — S92501D Displaced unspecified fracture of right lesser toe(s), subsequent encounter for fracture with routine healing: Secondary | ICD-10-CM | POA: Insufficient documentation

## 2022-08-12 HISTORY — DX: Displaced unspecified fracture of right lesser toe(s), subsequent encounter for fracture with routine healing: S92.501D

## 2022-10-21 IMAGING — CT CT RENAL STONE PROTOCOL
2 of 4 series · 16 of 46 positions shown, 18 images · non-contrast
Comparison: None.

CLINICAL DATA: Right flank pain.

EXAM:
CT ABDOMEN AND PELVIS WITHOUT CONTRAST
TECHNIQUE: Multidetector CT imaging of the abdomen and pelvis was performed
following the standard protocol without IV contrast.

[Series 2: axial st · axial · 0.79mm/px · z∈[+608,+978]mm · 13 of 85 slices shown, 15 images]
[im 7/85  soft-tissue]
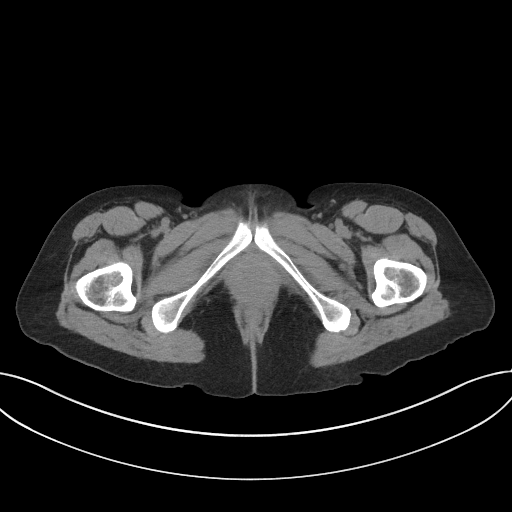
[im 7/85  bone]
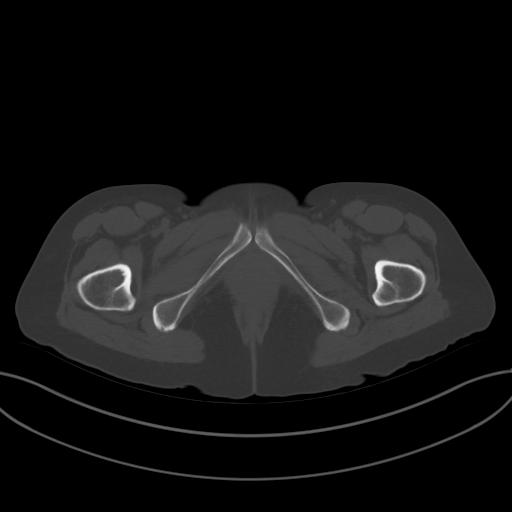
[im 13/85  soft-tissue]
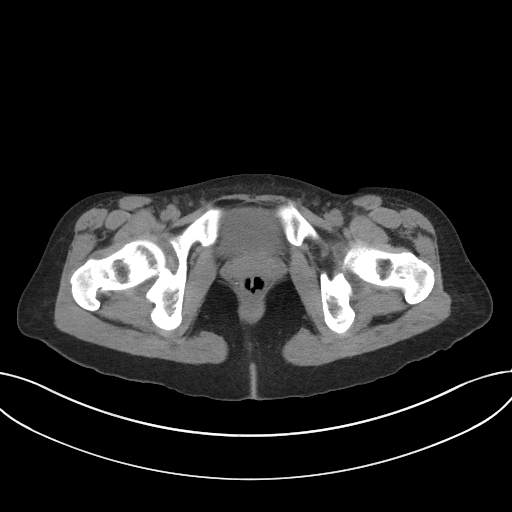
[im 19/85  soft-tissue]
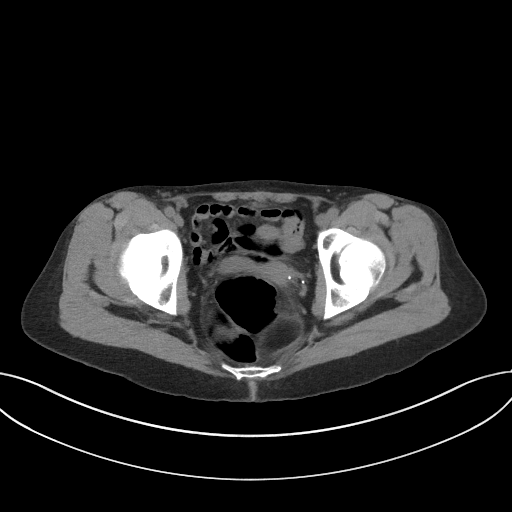
[im 25/85  soft-tissue]
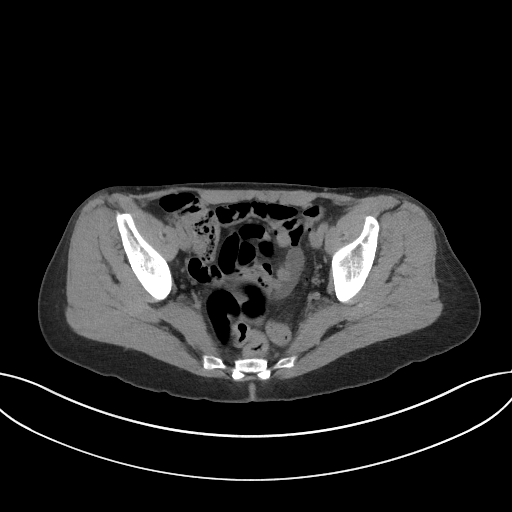
[im 32/85  soft-tissue]
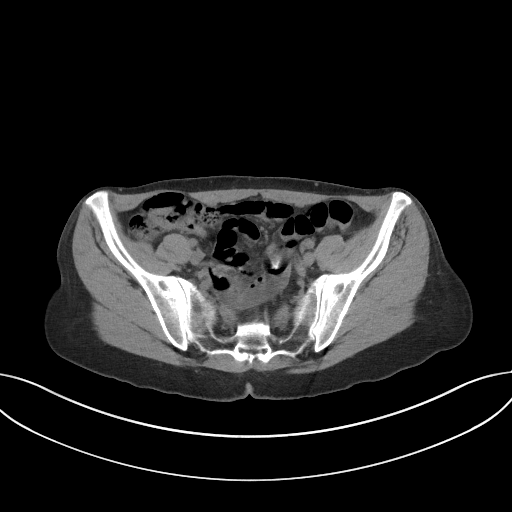
[im 38/85  soft-tissue]
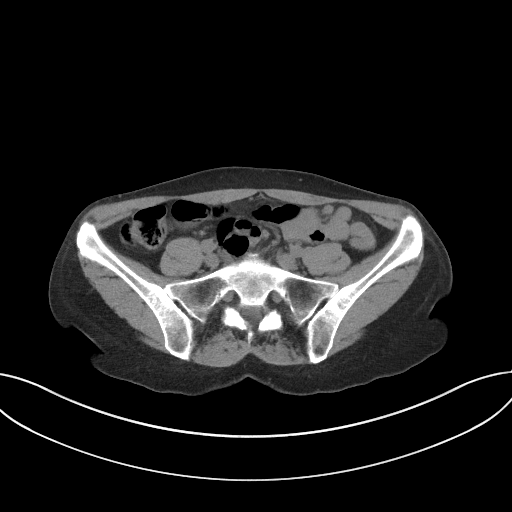
[im 44/85  soft-tissue]
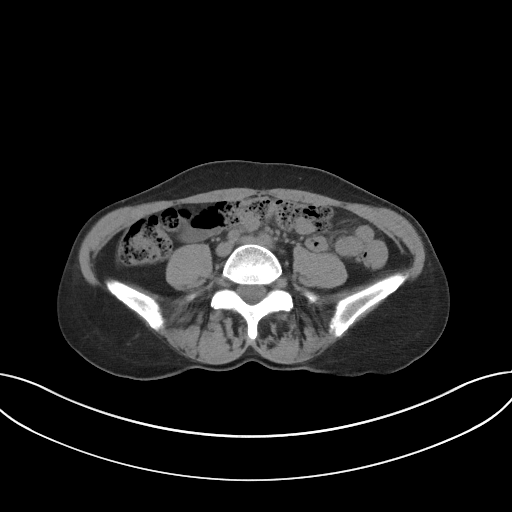
[im 50/85  soft-tissue]
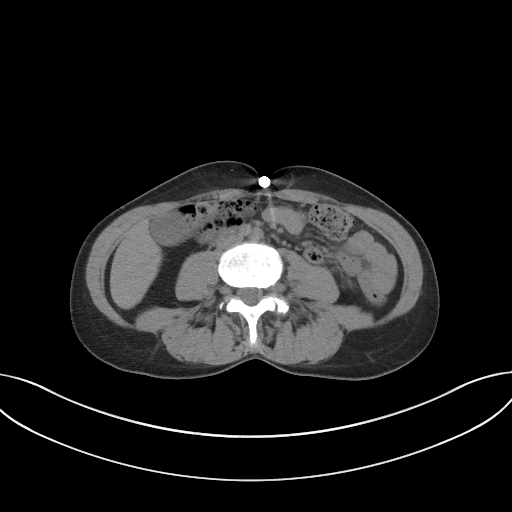
[im 57/85  soft-tissue]
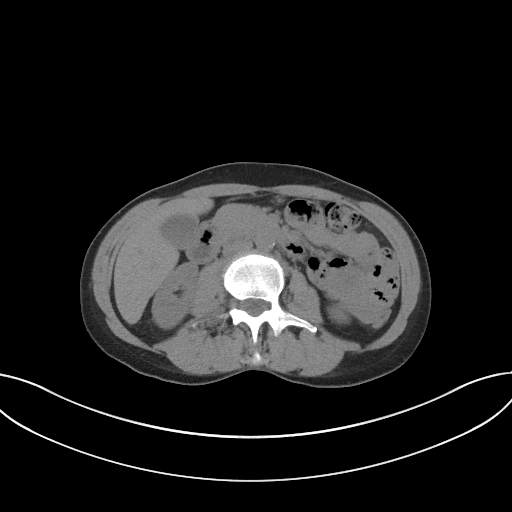
[im 57/85  bone]
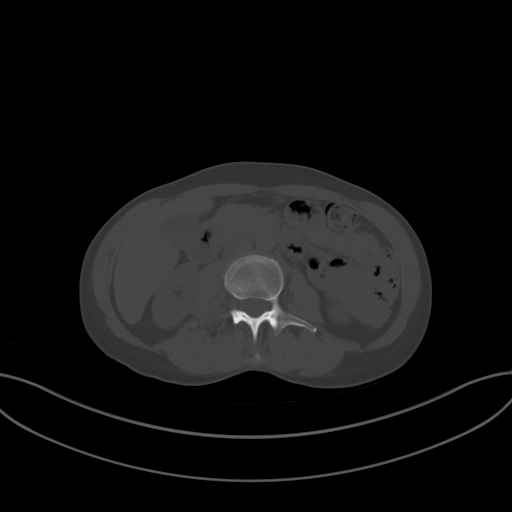
[im 63/85  soft-tissue]
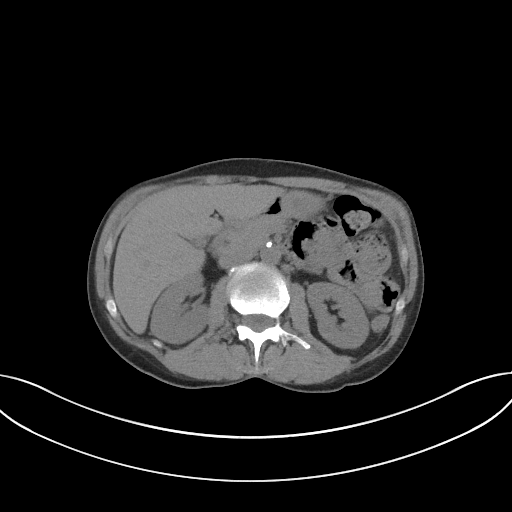
[im 69/85  soft-tissue]
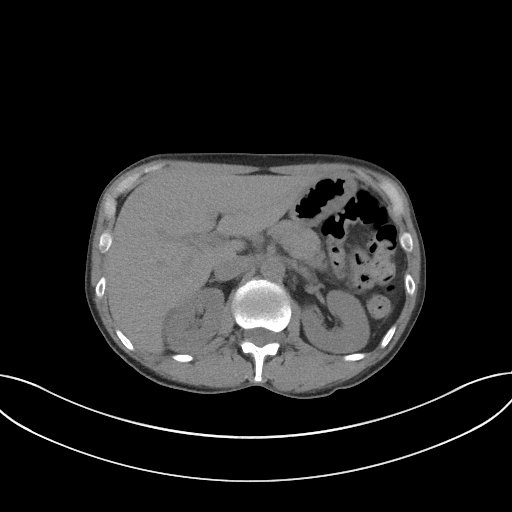
[im 75/85  soft-tissue]
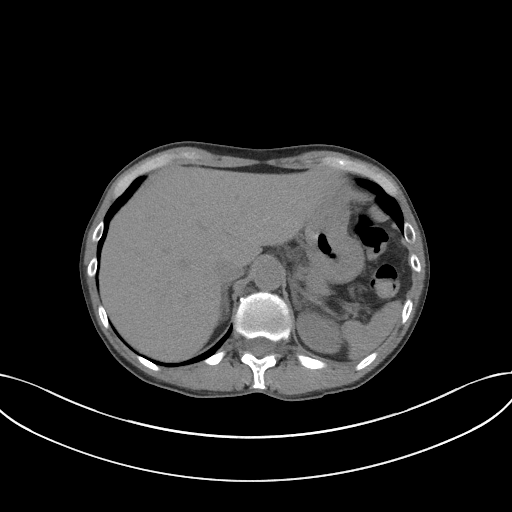
[im 81/85  soft-tissue]
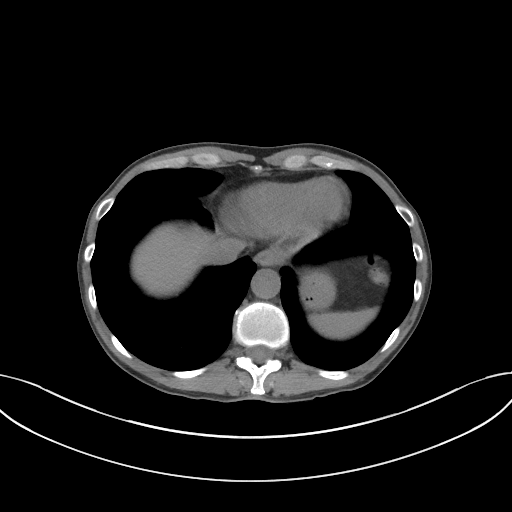

[Series 5: coronal st · coronal · 0.68mm/px · 3 of 69 slices shown]
[im 23/69  soft-tissue]
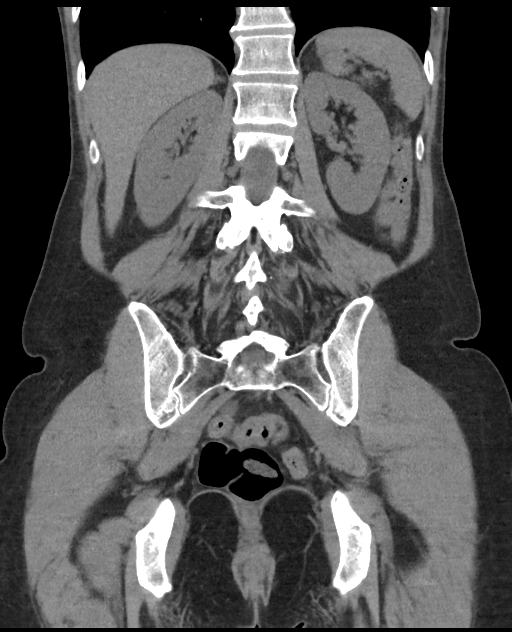
[im 31/69  soft-tissue]
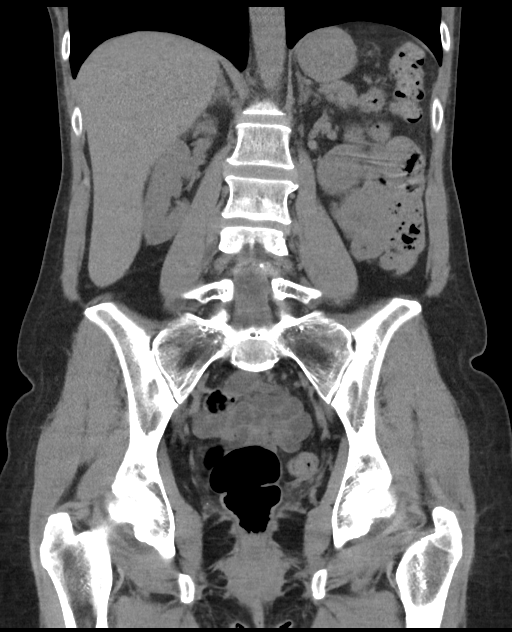
[im 38/69  soft-tissue]
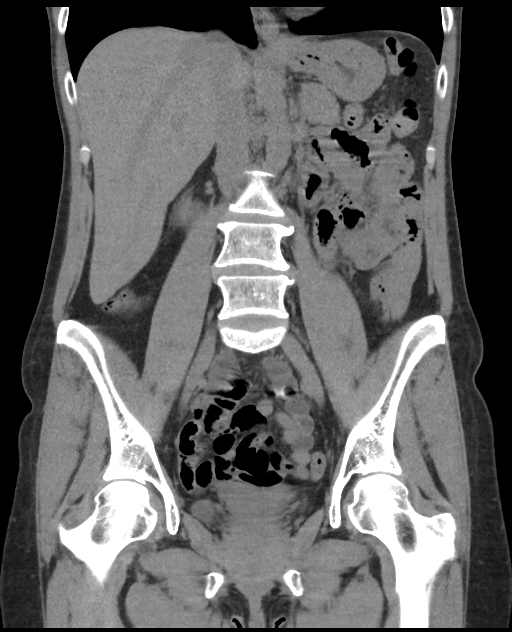

[16 of 46 positions shown; findings below may reference images not displayed]

FINDINGS: Lower chest: No acute abnormality.

Hepatobiliary: No focal liver abnormality is seen. No gallstones,
gallbladder wall thickening, or biliary dilatation.

Pancreas: Unremarkable. No pancreatic ductal dilatation or
surrounding inflammatory changes.

Spleen: Normal in size without focal abnormality.

Adrenals/Urinary Tract: Adrenal glands are unremarkable. Kidneys are
normal, without renal calculi, focal lesion, or hydronephrosis. The
urinary bladder is empty and subsequently limited in evaluation.

Stomach/Bowel: Stomach is within normal limits. The appendix is
surgically absent. No evidence of bowel wall thickening, distention,
or inflammatory changes.

Vascular/Lymphatic: Aortic atherosclerosis. No enlarged abdominal or
pelvic lymph nodes.

Reproductive: Status post hysterectomy. No adnexal masses.

Other: No abdominal wall hernia or abnormality. No abdominopelvic
ascites.

Musculoskeletal: No acute or significant osseous findings.
IMPRESSION: 1. No acute or active process within the abdomen or pelvis.
2. Aortic atherosclerosis.

Aortic Atherosclerosis ([id number]).

## 2022-10-24 ENCOUNTER — Other Ambulatory Visit: Payer: Self-pay | Admitting: Cardiology

## 2023-04-07 ENCOUNTER — Telehealth: Payer: Self-pay | Admitting: Cardiology

## 2023-04-07 NOTE — Telephone Encounter (Signed)
Patient c/o Palpitations:  STAT if patient reporting lightheadedness, shortness of breath, or chest pain  How long have you had palpitations/irregular HR/ Afib? Are you having the symptoms now? Started Monday morning around 5 am   Are you currently experiencing lightheadedness, SOB or CP? Yes  Do you have a history of afib (atrial fibrillation) or irregular heart rhythm? Yes  Have you checked your BP or HR? (document readings if available):  10/07 AM 169/96 10/08 PM 164/90 10/09 (while on the phone) 148/94  Are you experiencing any other symptoms? Chest heaviness and dizzy     Patient has been scheduled to see Elliot Cousin 10/14. Call transferred to triage due to active chest heaviness.

## 2023-04-07 NOTE — Telephone Encounter (Signed)
Spoke with patient and advised of provider recommendations to go to ER if she is having chest discomfort. She verbalized understanding

## 2023-04-07 NOTE — Telephone Encounter (Signed)
Spoke with patient and she states she was having palpitations. Her HR is currently at 97 and BP is 148/94. States she does not have pain but her chest feels heavy.  No SOB. Did advise to take her prn Diltiazem for her palpitations. Take her BP medications. Try relaxing and staying hydrated. She will give [place] a call back in 2 hours

## 2023-04-07 NOTE — Telephone Encounter (Signed)
Spoke with patient and she states she is feeling a little better from this morning. She has appointment scheduled with APP. Discussed ED precautions. Will forward to provider

## 2023-04-09 ENCOUNTER — Other Ambulatory Visit: Payer: Self-pay

## 2023-04-10 ENCOUNTER — Emergency Department (HOSPITAL_BASED_OUTPATIENT_CLINIC_OR_DEPARTMENT_OTHER)
Admission: EM | Admit: 2023-04-10 | Discharge: 2023-04-10 | Disposition: A | Discharge status: Home / Self Care | Payer: BC Managed Care – PPO | Attending: Emergency Medicine | Admitting: Emergency Medicine

## 2023-04-10 ENCOUNTER — Encounter (HOSPITAL_BASED_OUTPATIENT_CLINIC_OR_DEPARTMENT_OTHER): Payer: Self-pay | Admitting: Emergency Medicine

## 2023-04-10 ENCOUNTER — Emergency Department (HOSPITAL_BASED_OUTPATIENT_CLINIC_OR_DEPARTMENT_OTHER): Payer: BC Managed Care – PPO

## 2023-04-10 DIAGNOSIS — R0789 Other chest pain: Secondary | ICD-10-CM | POA: Insufficient documentation

## 2023-04-10 DIAGNOSIS — I10 Essential (primary) hypertension: Secondary | ICD-10-CM | POA: Insufficient documentation

## 2023-04-10 DIAGNOSIS — R002 Palpitations: Secondary | ICD-10-CM | POA: Insufficient documentation

## 2023-04-10 DIAGNOSIS — R42 Dizziness and giddiness: Secondary | ICD-10-CM | POA: Insufficient documentation

## 2023-04-10 DIAGNOSIS — R079 Chest pain, unspecified: Secondary | ICD-10-CM

## 2023-04-10 DIAGNOSIS — Z79899 Other long term (current) drug therapy: Secondary | ICD-10-CM | POA: Insufficient documentation

## 2023-04-10 LAB — BASIC METABOLIC PANEL
Anion gap: 14 (ref 5–15)
BUN: 20 mg/dL (ref 6–20)
CO2: 23 mmol/L (ref 22–32)
Calcium: 9.4 mg/dL (ref 8.9–10.3)
Chloride: 90 mmol/L — ABNORMAL LOW (ref 98–111)
Creatinine, Ser: 0.73 mg/dL (ref 0.44–1.00)
GFR, Estimated: >60 mL/min (ref >60)
Glucose, Bld: 107 mg/dL — ABNORMAL HIGH (ref 70–99)
Potassium: 4.7 mmol/L (ref 3.5–5.1)
Sodium: 127 mmol/L — ABNORMAL LOW (ref 135–145)

## 2023-04-10 LAB — CBC WITH DIFFERENTIAL/PLATELET
Abs Immature Granulocytes: 0.01 10*3/uL (ref 0.00–0.07)
Basophils Absolute: 0 10*3/uL (ref 0.0–0.1)
Basophils Relative: 0 %
Eosinophils Absolute: 0 10*3/uL (ref 0.0–0.5)
Eosinophils Relative: 0 %
HCT: 40 % (ref 36.0–46.0)
Hemoglobin: 14.2 g/dL (ref 12.0–15.0)
Immature Granulocytes: 0 %
Lymphocytes Relative: 29 %
Lymphs Abs: 1.8 10*3/uL (ref 0.7–4.0)
MCH: 34.2 pg — ABNORMAL HIGH (ref 26.0–34.0)
MCHC: 35.5 g/dL (ref 30.0–36.0)
MCV: 96.4 fL (ref 80.0–100.0)
Monocytes Absolute: 0.5 10*3/uL (ref 0.1–1.0)
Monocytes Relative: 7 %
Neutro Abs: 4 10*3/uL (ref 1.7–7.7)
Neutrophils Relative %: 64 %
Platelets: 180 10*3/uL (ref 150–400)
RBC: 4.15 MIL/uL (ref 3.87–5.11)
RDW: 11.3 % — ABNORMAL LOW (ref 11.5–15.5)
WBC: 6.3 10*3/uL (ref 4.0–10.5)
nRBC: 0 % (ref 0.0–0.2)

## 2023-04-10 LAB — TROPONIN I (HIGH SENSITIVITY)
Troponin I (High Sensitivity): 3 ng/L (ref <18)
Troponin I (High Sensitivity): 2 ng/L (ref <18)

## 2023-04-10 LAB — D-DIMER, QUANTITATIVE: D-Dimer, Quant: <0.27 ug{FEU}/mL (ref 0.00–0.50)

## 2023-04-10 MED ORDER — KETOROLAC TROMETHAMINE 15 MG/ML IJ SOLN
15.0000 mg | Freq: Once | INTRAMUSCULAR | Status: AC
  Administered 2023-04-10: 15 mg via INTRAVENOUS
  Filled 2023-04-10: qty 1

## 2023-04-10 MED ORDER — SODIUM CHLORIDE 0.9 % IV BOLUS
1000.0000 mL | Freq: Once | INTRAVENOUS | Status: AC
  Administered 2023-04-10: 1000 mL via INTRAVENOUS

## 2023-04-10 NOTE — ED Notes (Signed)
Patient transported to X-ray 

## 2023-04-10 NOTE — Discharge Instructions (Signed)
As discussed, workup today overall reassuring.  It does not look like you are having a heart attack.  Low clinical suspicion for blood clot in the lung.  No evidence of pneumonia, collapsed lung or other abnormality on exam.  Recommend keeping your appointment on Monday for further assessment of your palpitations as well as dizziness.  They may place you on another cardiac monitor to see if there is any additional arrhythmia.  Please do not hesitate to return to emergency department for worrisome signs and symptoms we discussed become apparent.

## 2023-04-10 NOTE — ED Triage Notes (Addendum)
Palpitations, chest "heaviness", fatigue, n/v, dizziness, and elevated BP since Monday. Reports nothing significantly changed today. Hx of bigeminy. Denies pain.

## 2023-04-10 NOTE — ED Provider Notes (Signed)
EMERGENCY DEPARTMENT AT [facility] Provider Note   CSN: [id number] Arrival date & time: 04/10/23  1248     History  Chief Complaint  Patient presents with   Hypertension    Melanie Bush is a 57 y.o. female.   Hypertension   57 year old female presents emergency department with complaints of chest pain, palpitations, elevated blood pressure, dizziness.  Patient states that she has been experiencing symptoms since this past Monday.  Describes chest pain as heaviness and central.  Denies any known exacerbating relieving factors.  States the pain has been more present and has not but seems to be random in it's "coming and going."  States that she has had intermittent feelings of palpitations.  Patient with known history of PVCs as well as bigeminy.  States that feelings of palpitations feels similar to when she was first diagnosed with PVCs as well as bigeminy.  Reports also some feelings of dizziness described as lightheadedness when her palpitations become more pronounced.  Denies any loss of consciousness.  States that she still is with some central chest pressure.  Denies any fever, chills, cough, abdominal pain, urinary symptoms, change in bowel habits.  Denies any history of DVT/PE, recent surgery/immobilization, known coagulopathy, known malignancy.  Patient states that she is on estrogen replacement therapy.  Past medical history significant for PVC, bigeminy, demyelinating disease, hypertension, palpitations, endometriosis  Home Medications Prior to Admission medications   Medication Sig Start Date End Date Taking? Authorizing Provider  acebutolol (SECTRAL) 400 MG capsule Take 1 capsule (400 mg total) by mouth daily. 10/26/22   Revankar, Aundra Dubin, MD  albuterol (VENTOLIN HFA) 108 (90 Base) MCG/ACT inhaler Inhale 2 puffs into the lungs every 4 (four) hours as needed for wheezing or shortness of breath. Patient not taking: Reported on 04/30/2022 03/11/20   [provider]  amLODipine (NORVASC) 2.5 MG tablet Take 2.5 mg by mouth 2 (two) times daily. 02/24/23   [provider]  busPIRone (BUSPAR) 5 MG tablet Take 5 mg by mouth as needed (anxiety). 04/07/22   [provider]  dicyclomine (BENTYL) 20 MG tablet Take 1 tablet (20 mg total) by mouth 2 (two) times daily. Patient not taking: Reported on 04/30/2022 05/28/21   Palumbo, April, MD  diltiazem (CARDIZEM) 30 MG tablet Take 30 mg by mouth as needed for other. palpitations    [provider]  estradiol (ESTRACE) 0.5 MG tablet Take 0.5 mg by mouth daily. 01/17/20   [provider]      Allergies    Amoxicillin and Penicillin g    Review of Systems   Review of Systems  All other systems reviewed and are negative.   Physical Exam Updated Vital Signs BP 117/70 (BP Location: Left Arm)   Pulse 65   Temp 98.5 F (36.9 C) (Oral)   Resp 17   Ht 5\' 6"  (1.676 m)   Wt 56.7 kg   SpO2 100%   BMI 20.18 kg/m  Physical Exam Vitals and nursing note reviewed.  Constitutional:      General: She is not in acute distress.    Appearance: She is well-developed.  HENT:     Head: Normocephalic and atraumatic.  Eyes:     Conjunctiva/sclera: Conjunctivae normal.  Cardiovascular:     Rate and Rhythm: Normal rate and regular rhythm.     Pulses: Normal pulses.     Heart sounds: No murmur heard. Pulmonary:     Effort: Pulmonary effort is normal. No  respiratory distress.     Comments: Faint wheeze auscultated bilateral lung fields.  No chest wall tenderness. Abdominal:     Palpations: Abdomen is soft.     Tenderness: There is no abdominal tenderness.  Musculoskeletal:        General: No swelling.     Cervical back: Neck supple.     Right lower leg: No edema.  Skin:    General: Skin is warm and dry.     Capillary Refill: Capillary refill takes less than 2 seconds.  Neurological:     Mental Status: She is alert.  Psychiatric:        Mood and Affect: Mood normal.      ED Results / Procedures / Treatments   Labs (all labs ordered are listed, but only abnormal results are displayed) Labs Reviewed  BASIC METABOLIC PANEL - Abnormal; Notable for the following components:      Result Value   Sodium 127 (*)    Chloride 90 (*)    Glucose, Bld 107 (*)    All other components within normal limits  CBC WITH DIFFERENTIAL/PLATELET - Abnormal; Notable for the following components:   MCH 34.2 (*)    RDW 11.3 (*)    All other components within normal limits  D-DIMER, QUANTITATIVE  TROPONIN I (HIGH SENSITIVITY)  TROPONIN I (HIGH SENSITIVITY)    EKG EKG Interpretation Date/Time:  Saturday April 10 2023 12:59:41 EDT Ventricular Rate:  79 PR Interval:  130 QRS Duration:  85 QT Interval:  369 QTC Calculation: 423 R Axis:   68  Text Interpretation: Sinus rhythm Right atrial enlargement since last tracing no significant change Confirmed by Rolan Bucco [phone]) on 04/10/2023 1:41:55 PM  Radiology DG Chest 2 View  Result Date: 04/10/2023 CLINICAL DATA:  Chest pain EXAM: CHEST - 2 VIEW COMPARISON:  None Available. FINDINGS: Cardiac and mediastinal contours are within normal limits. Mild hyperinflation. No focal airspace infiltrate, pulmonary edema, pleural effusion or pneumothorax. The visualized upper abdominal bowel gas pattern is normal. No acute osseous abnormality. IMPRESSION: No active cardiopulmonary disease. Electronically Signed   By: Malachy Moan M.D.   On: 04/10/2023 15:31    Procedures Procedures    Medications Ordered in ED Medications  sodium chloride 0.9 % bolus 1,000 mL ( Intravenous Stopped 04/10/23 1631)  ketorolac (TORADOL) 15 MG/ML injection 15 mg (15 mg Intravenous Given 04/10/23 1526)    ED Course/ Medical Decision Making/ A&P                                 Medical Decision Making Amount and/or Complexity of Data Reviewed Labs: ordered. Radiology: ordered.  Risk Prescription drug management.   This patient  presents to the ED for concern of chest pain, palpitations, dizziness, this involves an extensive number of treatment options, and is a complaint that carries with it a high risk of complications and morbidity.  The differential diagnosis includes ACS, PE, pneumothorax, aortic dissection, aortic aneurysm, pericarditis/myocarditis/tamponade, arrhythmia, other   Co morbidities that complicate the patient evaluation  See HPI   Additional history obtained:  Additional history obtained from EMR External records from outside source obtained and reviewed including hospital records   Lab Tests:  I Ordered, and personally interpreted labs.  The pertinent results include: Hyponatremia 127, hypochloremia of 98, otherwise electrolytes within the limits.  No renal dysfunction.  No leukocytosis.  No evidence of anemia.  Platelets within range.  D-dimer  negative.  Initial troponin of 3 with repeat 2   Imaging Studies ordered:  I ordered imaging studies including chest x-ray I independently visualized and interpreted imaging which showed no acute cardiopulmonary abnormalities I agree with the radiologist interpretation   Cardiac Monitoring: / EKG:  The patient was maintained on a cardiac monitor.  I personally viewed and interpreted the cardiac monitored which showed an underlying rhythm of: Sinus rhythm with right atrial enlargement.   Consultations Obtained:  N/a   Problem List / ED Course / Critical interventions / Medication management  Chest pain, palpitations I ordered medication including 1 L normal saline, Toradol   Reevaluation of the patient after these medicines showed that the patient improved I have reviewed the patients home medicines and have made adjustments as needed   Social Determinants of Health:  Chronic cigarette use.  Denies illicit drug use.   Test / Admission - Considered:  Chest pain, palpitations Vitals signs significant for hypertension blood pressure  142/103. Otherwise within normal range and stable throughout visit. Laboratory/imaging studies significant for: See above 57 year old female presents emergency department complaints of chest pain, palpitations, dizziness, elevated blood pressure.  Patient with history of PVCs as well as bigeminy and states that current symptoms that she is experienced with Monday were similar to when she was first diagnosed.  Regarding chest pain, left-sided without radiation.  Chest pain not necessarily exertional nature but somewhat associated with her palpitations.  Patient with delta negative troponin, lack of acute ischemic change on EKG; low suspicion for ACS.  Patient with negative D-dimer; low suspicion for PE.  Patient without pulse deficits, chest pain rating to back, hypertension, neurologic deficits; low suspicion for aortic dissection.  Patient did note improvement of symptoms of chest pain with administration of Toradol as well as 1 L of normal saline while in the ED.  Recommend continued use of anti-inflammatories concern for possible musculoskeletal etiology of patient's chest pain.  Regarding palpitations, patient without any recurrent episodes of feelings of palpitations while in the ED while being observed for 4 and half hours on cardiac monitoring.  Patient has upcoming appointment on Monday with cardiology for further assessment.  Regarding dizziness, described as lightheadedness seemingly tied to episodes of palpitations.  Unable to elicit similar symptoms on the ED even with exertion.  Will again, recommend follow-up in the outpatient setting with cardiology/primary care.  Further workup deemed unnecessary at this time while in the ED.  Treatment plan discussed at length with patient and she acknowledged understanding was agreeable to said plan.  Patient will well-appearing, afebrile in no acute distress. Worrisome signs and symptoms were discussed with the patient, and the patient acknowledged  understanding to return to the ED if noticed. Patient was stable upon discharge.          Final Clinical Impression(s) / ED Diagnoses Final diagnoses:  Chest pain, unspecified type  Palpitations    Rx / DC Orders ED Discharge Orders     None         Peter Garter, [place] 04/10/23 1735    Rolan Bucco, MD 04/11/23 [phone]

## 2023-04-10 NOTE — ED Notes (Signed)
Patient was ambulated around the ER on a portable monitor. No significant changes in HR, BP or O2 sats. Little to no light headedness or dizziness.

## 2023-04-10 NOTE — ED Notes (Signed)
Patient back in room from XR

## 2023-04-11 NOTE — Progress Notes (Signed)
 " Cardiology Office Note:  .   Date:  04/11/2023  ID:  Melanie Bush, DOB [birth year], MRN [id number] PCP: Hartwell Elvie RIGGERS  [facility] Providers Cardiologist:  None    History of Present Illness: .   Melanie Bush is a 57 y.o. female with a past medical history of PVCs, bigeminy, hypertension, tobacco abuse.  08/12/2020 calcium score of 0 02/08/2018 stress echo EF 50 to 55%, no echocardiographic evidence for stress-induced ischemia 03/02/2018 monitor sinus rhythm average heart rate 76 bpm, frequent PVCs and ventricular bigeminy  Most recently evaluated by Dr. Edwyna on 04/30/2022, she was doing well from a cardiac perspective, her palpitations are not bothersome to her and she was advised she could follow-up in 12 months.  Most recently evaluated in the emergency department on 04/10/2023 for chest pain, palpitations, dizziness, elevated blood pressure, symptoms have been occurring for approximately a week intermittently.  Lab work revealed hyponatremia with sodium 127, hypochloremia 98, troponin 3 > 2, CBC was normal.  She presents today for follow-up after her recent ED visit as outlined above.  She has been feeling very poorly over the last week, profound fatigue, legs feeling heavy and dizziness.  Her sodium was noted to be 127.  She has not made any changes to medications, lifestyle, etc over the last several months. She denies chest pain, dyspnea, pnd, orthopnea, n, v, dizziness, syncope, edema, weight gain, or early satiety.   ROS: Review of Systems  Constitutional:  Positive for malaise/fatigue.  Neurological:  Positive for dizziness.  All other systems reviewed and are negative.    Studies Reviewed: .        Cardiac Studies & Procedures     STRESS TESTS  ECHOCARDIOGRAM STRESS TEST 02/08/2018  Narrative *[facility]* [address] [place], [place] [address] [phone]  ------------------------------------------------------------------- Stress  Echocardiography  Patient:    Melanie, Bush MR #:       [id number] Study Date: 02/08/2018 Gender:     F Age:        35 Height:     170.2 cm Weight:     62.1 kg BSA:        1.71 m^2 Pt. Status: Room:  ATTENDING    Mililani Murthy Edwyna, MD ORDERING     Tonna Palazzi Edwyna, MD REFERRING    Malaiya Paczkowski Edwyna, MD PERFORMING   [facility] [place] [facility] SONOGRAPHER  Fairy Canton, RDCS  cc:  -------------------------------------------------------------------  ------------------------------------------------------------------- Indications:      Palpitations 785.1. Chest tightness.  ------------------------------------------------------------------- History:   Risk factors:  Current tobacco use.  ------------------------------------------------------------------- Study Conclusions  - Stress ECG conclusions: There were no stress arrhythmias or conduction abnormalities. Frequent ventricular ectopy with bigeminy, uncahnged during stress and recovery. The stress ECG was normal. - Staged echo: There was no echocardiographic evidence for stress-induced ischemia.  ------------------------------------------------------------------- Labs, prior tests, procedures, and surgery: ECG.     Abnormal.  ------------------------------------------------------------------- Study data:   Study status:  Routine.  Consent:  The risks, benefits, and alternatives to the procedure were explained to the patient and informed consent was obtained.  Procedure:  Initial setup. The patient was brought to the laboratory. A baseline ECG was recorded. Surface ECG leads and automatic cuff blood pressure measurements were monitored. Treadmill exercise testing was performed using the Bruce protocol. The patient exercised for 6 min, to protocol stage 2. Exercise was terminated due to fatigue. Transthoracic stress echocardiography. Images were captured at baseline and peak exercise.  Study completion:  The patient tolerated  the  procedure well. There were no complications. Bruce protocol. Stress echocardiography.  Birthdate:  Patient birthdate: 05-01-1966.  Age:  Patient is 57 yr old.  Sex:  Gender: female.    BMI: 21.5 kg/m^2.  Blood pressure:     140/78  Patient status:  Outpatient.  Study date:  Study date: 02/08/2018. Study time: 01:45 PM.  -------------------------------------------------------------------  ------------------------------------------------------------------- Baseline ECG:  Normal.  ------------------------------------------------------------------- Stress protocol:  +---------------------+---+------------+--------+ !Stage                !HR !BP (mmHg)   !Symptoms! +---------------------+---+------------+--------+ !Baseline             !77 !140/78 (99) !None    ! +---------------------+---+------------+--------+ !Stage 1              !125!------------!--------! +---------------------+---+------------+--------+ !Stage 2              !142!------------!--------! +---------------------+---+------------+--------+ !Immediate post stress!144!------------!--------! +---------------------+---+------------+--------+ !Recovery; 1 min      !121!181/99 (126)!--------! +---------------------+---+------------+--------+ !Recovery; 2 min      !101!------------!--------! +---------------------+---+------------+--------+ !Recovery; 3 min      !83 !------------!--------! +---------------------+---+------------+--------+ !Recovery; 5 min      !81 !124/90 (101)!--------! +---------------------+---+------------+--------+ !Recovery; 10 min     !78 !------------!--------! +---------------------+---+------------+--------+  ------------------------------------------------------------------- Stress results:   Maximal heart rate during stress was 144 bpm (86% of maximal predicted heart rate). The maximal predicted heart rate was 167 bpm.The target heart rate was achieved. The heart rate response to stress was  normal. There was a normal resting blood pressure with an appropriate response to stress. The rate-pressure product for the peak heart rate and blood pressure was 78098 mm Hg/min.  The patient experienced no chest pain during stress. Functional capacity was normal.  ------------------------------------------------------------------- Stress ECG:  There were no stress arrhythmias or conduction abnormalities. Frequent ventricular ectopy with bigeminy, uncahnged during stress and recovery.  The stress ECG was normal.  ------------------------------------------------------------------- Baseline:  - LV size was normal. - The estimated LV ejection fraction was 50-55%. - Normal wall motion; no LV regional wall motion abnormalities.  Immediate post stress:  - LV size was mildly reduced. - The estimated LV ejection fraction was 60-65%. - No evidence for new LV regional wall motion abnormalities.  ------------------------------------------------------------------- Stress echo results:     Left ventricular ejection fraction was normal at rest and with stress. There was no echocardiographic evidence for stress-induced ischemia.  ------------------------------------------------------------------- Measurements  Left ventricle                        Value        Reference LV ID, ED, PLAX chordal        (L)    41.6  mm     43 - 52 LV ID, ES, PLAX chordal               25.6  mm     23 - 38 LV fx shortening, PLAX chordal        38    %      >=29 LV PW thickness, ED                   8.96  mm     ---------- IVS/LV PW ratio, ED                   0.96         <=1.3  Ventricular septum  Value        Reference IVS thickness, ED                     8.61  mm     ----------  LVOT                                  Value        Reference LVOT peak velocity, S                 85.8  cm/s   ---------- LVOT mean velocity, S                 56.5  cm/s   ---------- LVOT VTI, S                            18    cm     ----------  Aorta                                 Value        Reference Aortic root ID, ED                    26    mm     ---------- Ascending aorta ID, A-P, S            31    mm     ---------- Descending aorta ID, proximal         22    mm     ----------  Left atrium                           Value        Reference LA ID, A-P, ES                        29    mm     ---------- LA ID/bsa, A-P                        1.69  cm/m^2 <=2.2 LA volume, S                          36.3  ml     ---------- LA volume/bsa, S                      21.2  ml/m^2 ---------- LA volume, ES, 1-p A4C                31.4  ml     ---------- LA volume/bsa, ES, 1-p A4C            18.3  ml/m^2 ---------- LA volume, ES, 1-p A2C                39    ml     ---------- LA volume/bsa, ES, 1-p A2C            22.8  ml/m^2 ----------  Mitral valve                          Value  Reference Mitral E-wave peak velocity           66.4  cm/s   ---------- Mitral A-wave peak velocity           37    cm/s   ---------- Mitral deceleration time       (H)    352   ms     150 - 230 Mitral E/A ratio, peak                1.8          ----------  Tricuspid valve                       Value        Reference Tricuspid regurg peak velocity        263   cm/s   ---------- Tricuspid peak RV-RA gradient         28    mm Hg  ----------  Right atrium                          Value        Reference RA ID, S-I, ES, A4C                   43.8  mm     34 - 49 RA area, ES, A4C                      13.9  cm^2   8.3 - 19.5 RA volume, ES, A/L                    35.9  ml     ---------- RA volume/bsa, ES, A/L                21    ml/m^2 ----------  Legend: (L)  and  (H)  mark values outside specified reference range.  ------------------------------------------------------------------- Prepared and Electronically Authenticated by  Redell Leiter, MD 2019-08-14T11:55:28     [facility]  CARDIAC EVENT MONITOR  01/24/2018  Narrative Melanie Bush, DOB 02-06-1966, MRN [id number]  EVENT MONITOR REPORT:   Patient was monitored from 01/24/2018 to 02/22/2018. Indication:                    Syncope and collapse Ordering physician:  Ikeem Cleckler JONELLE Crape, MD Referring physician:  Manasseh Pittsley JONELLE Crape, MD   Baseline rhythm: Sinus with the baseline heart rate of 76  Minimum heart rate was recorded at 52 and maximum was 118/min  Atrial arrhythmia: None significant  Ventricular arrhythmia: Frequent PVCs and ventricular bigeminy frequently  Conduction abnormality: None significant  Symptoms: Palpitations and lightheadedness   Conclusion: Patient's event monitoring was abnormal.  Patient had frequent PVCs and at times ventricular bigeminy.  This coincided with PVCs occurring frequently many at times.  Also there was a long pauses of about 8-seconds which we confirmed to be artifactual.  At that time the patient had taken off the monitor leads.  Interpreting  cardiologist: Breandan People JONELLE Crape, MD Date: 03/02/2018 4:10 PM   CT SCANS  CT CARDIAC SCORING (SELF PAY ONLY) 08/12/2020  Addendum 08/12/2020  3:23 PM ADDENDUM REPORT: 08/12/2020 15:20  CLINICAL DATA:  Risk stratification  EXAM: Coronary Calcium Score  TECHNIQUE: The patient was scanned on a [employer] scanner. Axial non-contrast 3 mm slices were carried out through the heart. The data set was analyzed on  a dedicated work station and scored using the [employer] .  FINDINGS: Non-cardiac: See separate report from [facility].  Ascending Aorta: Normal caliber. No calcifications.  Pericardium: Normal  Coronary arteries: Normal coronary origins.  IMPRESSION: Coronary calcium score of 0. This was 0 percentile for age and sex matched control.  Wilbert Bihari   Electronically Signed By: Wilbert Bihari On: 08/12/2020 15:20  Narrative EXAM: OVER-READ INTERPRETATION  CT CHEST  The following report is an over-read performed by  radiologist Dr. Toribio Aye of [facility] on 08/12/2020. This over-read does not include interpretation of cardiac or coronary anatomy or pathology. The coronary calcium score interpretation by the cardiologist is attached.  COMPARISON:  None.  FINDINGS: Within the visualized portions of the thorax there are no suspicious appearing pulmonary nodules or masses, there is no acute consolidative airspace disease, no pleural effusions, no pneumothorax and no lymphadenopathy. Visualized portions of the upper abdomen are unremarkable. There are no aggressive appearing lytic or blastic lesions noted in the visualized portions of the skeleton.  IMPRESSION: 1. No significant incidental noncardiac findings are noted.  Electronically Signed: By: Toribio Aye M.D. On: 08/12/2020 14:23          Risk Assessment/Calculations:             Physical Exam:   VS:  There were no vitals taken for this visit.   Wt Readings from Last 3 Encounters:  04/10/23 125 lb (56.7 kg)  04/30/22 130 lb (59 kg)  07/11/21 131 lb 0.6 oz (59.4 kg)    GEN: Well nourished, well developed in no acute distress NECK: No JVD; No carotid bruits CARDIAC: RRR, no murmurs, rubs, gallops RESPIRATORY:  Clear to auscultation without rales, wheezing or rhonchi  ABDOMEN: Soft, non-tender, non-distended EXTREMITIES:  No edema; No deformity   ASSESSMENT AND PLAN: .   PVCs/bigeminy-she has noticed these have been more bothersome for her lately, we will repeat a 2-week monitor.  BMET in the hospital was significant for sodium of 127.  Will check TSH, magnesium, repeat BMET.  Continue Sectral  400 mg daily, continue Cardizem  30 mg as needed for palpitations.  Hyponatremia-sodium 127, this is likely contributing to her dizziness and fatigue, she is not on any new medications, she is not consuming excessive amounts of water. She is not on any medications that traditionally contribute to hyponatremia--rarely  Norvasc can.  Will repeat BMET today.  Hypertension-blood pressure is well-controlled at 118/78, for now we will continue Norvasc 2.5 mg daily.  Graves' disease-[place], will repeat thyroid  panel.         Dispo: Monitor x 2 weeks, labs per above.  Return in 6 weeks with me or Dr. Revankar.  Signed, Delon JAYSON Hoover, NP  "

## 2023-04-12 ENCOUNTER — Encounter: Payer: Self-pay | Admitting: Cardiology

## 2023-04-12 ENCOUNTER — Ambulatory Visit: Payer: BC Managed Care – PPO

## 2023-04-12 ENCOUNTER — Ambulatory Visit: Payer: BC Managed Care – PPO | Attending: Cardiology | Admitting: Cardiology

## 2023-04-12 VITALS — BP 118/78 | HR 88 | Ht 66.0 in | Wt 130.0 lb

## 2023-04-12 DIAGNOSIS — F17210 Nicotine dependence, cigarettes, uncomplicated: Secondary | ICD-10-CM | POA: Diagnosis not present

## 2023-04-12 DIAGNOSIS — R002 Palpitations: Secondary | ICD-10-CM

## 2023-04-12 DIAGNOSIS — I10 Essential (primary) hypertension: Secondary | ICD-10-CM

## 2023-04-12 DIAGNOSIS — I493 Ventricular premature depolarization: Secondary | ICD-10-CM

## 2023-04-12 DIAGNOSIS — E871 Hypo-osmolality and hyponatremia: Secondary | ICD-10-CM

## 2023-04-12 DIAGNOSIS — E0500 Thyrotoxicosis with diffuse goiter without thyrotoxic crisis or storm: Secondary | ICD-10-CM

## 2023-04-12 DIAGNOSIS — Z79899 Other long term (current) drug therapy: Secondary | ICD-10-CM

## 2023-04-12 NOTE — Patient Instructions (Signed)
Medication Instructions:  Your physician recommends that you continue on your current medications as directed. Please refer to the Current Medication list given to you today.  *If you need a refill on your cardiac medications before your next appointment, please call your pharmacy*   Lab Work: Your physician recommends that you return for lab work in: Today for BMP, Magnesium, TSH, T3 & T4  If you have labs (blood work) drawn today and your tests are completely normal, you will receive your results only by: MyChart Message (if you have MyChart) OR A paper copy in the mail If you have any lab test that is abnormal or we need to change your treatment, we will call you to review the results.   Testing/Procedures: You have been asked to wear a [employer] Heart Monitor today. It is to be worn for 14 days. Please remove the monitor on 10/28 and mail back in the box provided.  If you have any questions about the monitor please call the company at [phone]     Follow-Up: At [facility], you and your health needs are our priority.  As part of our continuing mission to provide you with exceptional heart care, we have created designated Provider Care Teams.  These Care Teams include your primary Cardiologist (physician) and Advanced Practice Providers (APPs -  Physician Assistants and Nurse Practitioners) who all work together to provide you with the care you need, when you need it.  We recommend signing up for the patient portal called "MyChart".  Sign up information is provided on this After Visit Summary.  MyChart is used to connect with patients for Virtual Visits (Telemedicine).  Patients are able to view lab/test results, encounter notes, upcoming appointments, etc.  Non-urgent messages can be sent to your provider as well.   To learn more about what you can do with MyChart, go to [email or web address].    Your next appointment:   6 week(s)  Provider:   Belva Crome, MD     Other Instructions

## 2023-04-13 ENCOUNTER — Telehealth: Payer: Self-pay | Admitting: Cardiology

## 2023-04-13 LAB — BASIC METABOLIC PANEL WITH GFR
BUN/Creatinine Ratio: 11 (ref 9–23)
BUN: 9 mg/dL (ref 6–24)
CO2: 22 mmol/L (ref 20–29)
Calcium: 9.2 mg/dL (ref 8.7–10.2)
Chloride: 98 mmol/L (ref 96–106)
Creatinine, Ser: 0.85 mg/dL (ref 0.57–1.00)
Glucose: 106 mg/dL — ABNORMAL HIGH (ref 70–99)
Potassium: 4.4 mmol/L (ref 3.5–5.2)
Sodium: 133 mmol/L — ABNORMAL LOW (ref 134–144)
eGFR: 79 mL/min/1.73

## 2023-04-13 LAB — T3, FREE: T3, Free: 2.6 pg/mL (ref 2.0–4.4)

## 2023-04-13 LAB — TSH: TSH: 0.504 u[IU]/mL (ref 0.450–4.500)

## 2023-04-13 LAB — MAGNESIUM: Magnesium: 2.2 mg/dL (ref 1.6–2.3)

## 2023-04-13 LAB — T4, FREE: Free T4: 1.47 ng/dL (ref 0.82–1.77)

## 2023-04-13 NOTE — Telephone Encounter (Signed)
Patient is returning call in regards to results. Requesting call back.

## 2023-04-13 NOTE — Telephone Encounter (Signed)
Patient informed of results.  

## 2023-04-22 ENCOUNTER — Encounter (HOSPITAL_BASED_OUTPATIENT_CLINIC_OR_DEPARTMENT_OTHER): Payer: Self-pay | Admitting: Emergency Medicine

## 2023-04-22 ENCOUNTER — Emergency Department (HOSPITAL_BASED_OUTPATIENT_CLINIC_OR_DEPARTMENT_OTHER): Payer: BC Managed Care – PPO

## 2023-04-22 ENCOUNTER — Other Ambulatory Visit: Payer: Self-pay

## 2023-04-22 ENCOUNTER — Emergency Department (HOSPITAL_BASED_OUTPATIENT_CLINIC_OR_DEPARTMENT_OTHER)
Admission: EM | Admit: 2023-04-22 | Discharge: 2023-04-22 | Disposition: A | Discharge status: Home / Self Care | Payer: BC Managed Care – PPO | Attending: Emergency Medicine | Admitting: Emergency Medicine

## 2023-04-22 DIAGNOSIS — I10 Essential (primary) hypertension: Secondary | ICD-10-CM | POA: Insufficient documentation

## 2023-04-22 DIAGNOSIS — Z79899 Other long term (current) drug therapy: Secondary | ICD-10-CM | POA: Insufficient documentation

## 2023-04-22 DIAGNOSIS — D649 Anemia, unspecified: Secondary | ICD-10-CM | POA: Insufficient documentation

## 2023-04-22 DIAGNOSIS — Z87891 Personal history of nicotine dependence: Secondary | ICD-10-CM | POA: Insufficient documentation

## 2023-04-22 DIAGNOSIS — R42 Dizziness and giddiness: Secondary | ICD-10-CM

## 2023-04-22 DIAGNOSIS — R002 Palpitations: Secondary | ICD-10-CM | POA: Diagnosis present

## 2023-04-22 LAB — CBC WITH DIFFERENTIAL/PLATELET
Abs Immature Granulocytes: 0.03 10*3/uL (ref 0.00–0.07)
Basophils Absolute: 0 10*3/uL (ref 0.0–0.1)
Basophils Relative: 0 %
Eosinophils Absolute: 0 10*3/uL (ref 0.0–0.5)
Eosinophils Relative: 0 %
HCT: 41.2 % (ref 36.0–46.0)
Hemoglobin: 14.6 g/dL (ref 12.0–15.0)
Immature Granulocytes: 0 %
Lymphocytes Relative: 15 %
Lymphs Abs: 1.4 10*3/uL (ref 0.7–4.0)
MCH: 34.1 pg — ABNORMAL HIGH (ref 26.0–34.0)
MCHC: 35.4 g/dL (ref 30.0–36.0)
MCV: 96.3 fL (ref 80.0–100.0)
Monocytes Absolute: 0.3 10*3/uL (ref 0.1–1.0)
Monocytes Relative: 3 %
Neutro Abs: 7.4 10*3/uL (ref 1.7–7.7)
Neutrophils Relative %: 82 %
Platelets: 191 10*3/uL (ref 150–400)
RBC: 4.28 MIL/uL (ref 3.87–5.11)
RDW: 11.6 % (ref 11.5–15.5)
WBC: 9.1 10*3/uL (ref 4.0–10.5)
nRBC: 0 % (ref 0.0–0.2)

## 2023-04-22 LAB — LACTIC ACID, PLASMA
Lactic Acid, Venous: 2.7 mmol/L (ref 0.5–1.9)
Lactic Acid, Venous: 2.2 mmol/L (ref 0.5–1.9)

## 2023-04-22 LAB — RAPID URINE DRUG SCREEN, HOSP PERFORMED
Amphetamines: NOT DETECTED
Barbiturates: NOT DETECTED
Benzodiazepines: NOT DETECTED
Cocaine: NOT DETECTED
Opiates: NOT DETECTED
Tetrahydrocannabinol: NOT DETECTED

## 2023-04-22 LAB — URINALYSIS, W/ REFLEX TO CULTURE (INFECTION SUSPECTED)
Bilirubin Urine: NEGATIVE
Glucose, UA: NEGATIVE mg/dL
Ketones, ur: 40 mg/dL — AB
Leukocytes,Ua: NEGATIVE
Nitrite: NEGATIVE
Protein, ur: NEGATIVE mg/dL
Specific Gravity, Urine: 1.025 (ref 1.005–1.030)
WBC, UA: NONE SEEN WBC/hpf (ref 0–5)
pH: 5.5 (ref 5.0–8.0)

## 2023-04-22 LAB — COMPREHENSIVE METABOLIC PANEL
ALT: 31 U/L (ref 0–44)
AST: 38 U/L (ref 15–41)
Albumin: 4.9 g/dL (ref 3.5–5.0)
Alkaline Phosphatase: 68 U/L (ref 38–126)
Anion gap: 18 — ABNORMAL HIGH (ref 5–15)
BUN: 17 mg/dL (ref 6–20)
CO2: 19 mmol/L — ABNORMAL LOW (ref 22–32)
Calcium: 8.9 mg/dL (ref 8.9–10.3)
Chloride: 93 mmol/L — ABNORMAL LOW (ref 98–111)
Creatinine, Ser: 0.73 mg/dL (ref 0.44–1.00)
GFR, Estimated: >60 mL/min (ref >60)
Glucose, Bld: 86 mg/dL (ref 70–99)
Potassium: 4.6 mmol/L (ref 3.5–5.1)
Sodium: 130 mmol/L — ABNORMAL LOW (ref 135–145)
Total Bilirubin: 1 mg/dL (ref 0.3–1.2)
Total Protein: 8 g/dL (ref 6.5–8.1)

## 2023-04-22 LAB — MAGNESIUM: Magnesium: 2.1 mg/dL (ref 1.7–2.4)

## 2023-04-22 LAB — TROPONIN I (HIGH SENSITIVITY)
Troponin I (High Sensitivity): 4 ng/L (ref <18)
Troponin I (High Sensitivity): 5 ng/L (ref <18)

## 2023-04-22 MED ORDER — LACTATED RINGERS IV BOLUS
1000.0000 mL | Freq: Once | INTRAVENOUS | Status: AC
  Administered 2023-04-22: 1000 mL via INTRAVENOUS

## 2023-04-22 MED ORDER — LORAZEPAM 2 MG/ML IJ SOLN
0.5000 mg | Freq: Once | INTRAMUSCULAR | Status: DC
  Filled 2023-04-22: qty 1

## 2023-04-22 MED ORDER — DIAZEPAM 5 MG/ML IJ SOLN
2.5000 mg | Freq: Once | INTRAMUSCULAR | Status: AC
  Administered 2023-04-22: 2.5 mg via INTRAVENOUS
  Filled 2023-04-22 (×2): qty 2

## 2023-04-22 NOTE — ED Provider Notes (Signed)
[place] [facility] Provider Note   CSN: [id number] Arrival date & time: 04/22/23  1040     History  Chief Complaint  Patient presents with   Palpitations   Follow-up    Rossy Gaspari is a 57 y.o. female.  HPI     57 year old female with a history of PVCs, bigeminy, hypertension, tobacco abuse, Graves' disease, recently seen in the emergency department October 12 with concern for chest pain and palpitations and followed up with cardiology, presents with concern for dizziness.   Did have labs completed with cardiology including thyroid function which appeared okay, and her sodium levels which were low.  Improved to 133 from 127.  Cardiology placed a Holter monitor.  Lightheaded like going to pass out, has been pretty severe since last night, whenever standing is worse than when laying down.  Up to 3AM feeling this.  Feeling shaky as well with it.  Feeling heart racing, palpitations.  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  Last night was worse.  Have not taken blood pressure medicine since friday before she came 10/12 Acebutolol every morning but not this AM, taking estradiol in AM.  Had been taking imodium once-twice a day every day for months.  Stopped taking it about a week before No other medications started or stopped   No chest pain.  Is having shortness of breath, not new.  Diarrhea all th etime, all her life, every morning then depends what she eats No numbness, weakness, no double vision or new vision changes, when walk feels lightheaded and legs hurt, feels shaky, no abdominal pain.  No facial droop or trouble talking   Has been crying about her dog being sick for 5 days No headache With family out of the room does report smoking cigarettes, denies drug use, does report drinking alcohol 1-2 drink per day.  Denies hx of withdrawal.   Reports has had tremulousness since she had graves disease diagnosis a long time about but worse in the past few weeks.      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 Treated cardiology [place]   Body aches 04/08/2022   Last Assessment & Plan:  Formatting of this note might be different from the original. Acute upper respiratory illness, suspected to be viral. Advised she present for repeating testing for Covid-19, as well as an influenza swab. Also advised that her illness is likely viral. 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 Authorizing  Provider  acebutolol (SECTRAL) 400 MG capsule Take 1 capsule (400 mg total) by mouth daily. 10/26/22   Revankar, Aundra Dubin, MD  albuterol (VENTOLIN HFA) 108 (90 Base) MCG/ACT inhaler Inhale 2 puffs into the lungs every 4 (four) hours as needed for wheezing or shortness of breath. 03/11/20   [provider]  amLODipine (NORVASC) 2.5 MG tablet Take 2.5 mg by mouth 2 (two) times daily. 02/24/23   [provider]  busPIRone (BUSPAR) 5 MG tablet Take 5 mg by mouth as needed (anxiety). 04/07/22   [provider]  dicyclomine (BENTYL) 20 MG tablet Take 1 tablet (20 mg total) by mouth 2 (two) times daily. 05/28/21   Palumbo, April, MD  diltiazem (CARDIZEM) 30 MG tablet Take 30 mg by mouth as needed for other. palpitations    [provider]  estradiol (ESTRACE) 0.5 MG tablet Take 0.5 mg by mouth daily. 01/17/20   [provider]      Allergies    Amoxicillin and Penicillin g    Review of Systems   Review of Systems  Physical Exam Updated Vital Signs BP 129/83   Pulse 81   Temp 98.5 F (36.9 C) (Oral)   Resp 18   Ht 5\' 6"  (1.676 m)   Wt 56.7 kg   SpO2 98%   BMI 20.18 kg/m  Physical Exam Vitals and nursing note reviewed.  Constitutional:      General: She is not in acute distress.    Appearance: She is well-developed. She is not diaphoretic.  HENT:     Head: Normocephalic and atraumatic.  Eyes:     Conjunctiva/sclera: Conjunctivae normal.  Cardiovascular:     Rate and Rhythm: Normal rate and regular rhythm.     Heart sounds: Normal heart sounds. No murmur heard.    No friction rub. No gallop.  Pulmonary:     Effort: Pulmonary effort is normal. No respiratory distress.     Breath sounds: Normal breath sounds. No wheezing or rales.  Abdominal:     General: There is no distension.     Palpations: Abdomen is soft.     Tenderness: There is no abdominal tenderness. There is no guarding.  Musculoskeletal:        General: No tenderness.      Cervical back: Normal range of motion.  Skin:    General: Skin is warm and dry.     Findings: No erythema or rash.  Neurological:     Mental Status: She is alert and oriented to person, place, and time.     ED Results / Procedures / Treatments   Labs (all labs ordered are listed, but only abnormal results are displayed) Labs Reviewed  CBC WITH DIFFERENTIAL/PLATELET - Abnormal; Notable for the following components:      Result Value   MCH 34.1 (*)    All other components within normal limits  COMPREHENSIVE METABOLIC PANEL - Abnormal; Notable for the following components:   Sodium 130 (*)    Chloride 93 (*)    CO2 19 (*)    Anion gap 18 (*)  All other components within normal limits  URINALYSIS, W/ REFLEX TO CULTURE (INFECTION SUSPECTED) - Abnormal; Notable for the following components:   Hgb urine dipstick TRACE (*)    Ketones, ur 40 (*)    Bacteria, UA RARE (*)    All other components within normal limits  LACTIC ACID, PLASMA - Abnormal; Notable for the following components:   Lactic Acid, Venous 2.7 (*)    All other components within normal limits  LACTIC ACID, PLASMA - Abnormal; Notable for the following components:   Lactic Acid, Venous 2.2 (*)    All other components within normal limits  MAGNESIUM  RAPID URINE DRUG SCREEN, HOSP PERFORMED  TROPONIN I (HIGH SENSITIVITY)  TROPONIN I (HIGH SENSITIVITY)    EKG EKG Interpretation Date/Time:  Thursday April 22 2023 10:51:34 EDT Ventricular Rate:  106 PR Interval:  121 QRS Duration:  88 QT Interval:  326 QTC Calculation: 433 R Axis:   76  Text Interpretation: Sinus tachycardia Biatrial enlargement Borderline T wave abnormalities  No significant change since last tracing Confirmed by Alvira Monday ([id number]) on 04/22/2023 11:38:22 AM  Radiology CT Head Wo Contrast  Result Date: 04/22/2023 CLINICAL DATA:  Provided history: Stroke, follow up Patient reports dizziness. EXAM: CT HEAD WITHOUT CONTRAST TECHNIQUE:  Contiguous axial images were obtained from the base of the skull through the vertex without intravenous contrast. RADIATION DOSE REDUCTION: This exam was performed according to the departmental dose-optimization program which includes automated exposure control, adjustment of the mA and/or kV according to patient size and/or use of iterative reconstruction technique. COMPARISON:  Brain MRI 09/19/2021 FINDINGS: Brain: No intracranial hemorrhage, mass effect, or midline shift. No hydrocephalus. The basilar cisterns are patent. Similar atrophy to prior exam. No evidence of territorial infarct or acute ischemia. No extra-axial or intracranial fluid collection. Vascular: No hyperdense vessel or unexpected calcification. Skull: Normal. Negative for fracture or focal lesion. Sinuses/Orbits: Trace opacification of left mastoid air cells, unchanged from prior exam. No paranasal sinus inflammation. Other: None. IMPRESSION: 1. No acute intracranial abnormality. 2. Similar atrophy to March 2023 MRI. Electronically Signed   By: Narda Rutherford M.D.   On: 04/22/2023 15:26    Procedures Procedures    Medications Ordered in ED Medications  diazepam (VALIUM) injection 2.5 mg (2.5 mg Intravenous Given 04/22/23 1353)  lactated ringers bolus 1,000 mL (0 mLs Intravenous Stopped 04/22/23 1515)    ED Course/ Medical Decision Making/ A&P Clinical Course as of 04/22/23 2158  Thu Apr 22, 2023  1655 Patient's repeat lactate is downtrending.  On reassessment, the patient reports that she feels significantly improved.  Is still mildly tremulous on exam but vitals are normal with no signs of severe alcohol withdrawal.  Orthostatics were normal.  She is tolerating p.o.  She stable for discharge home with close outpatient follow-up and was given strict return precautions. [VK]    Clinical Course User Index [VK] Rexford Maus, DO                                    57 year old female with a history of PVCs, bigeminy,  hypertension, tobacco abuse, Graves' disease, recently seen in the emergency department October 12 with concern for palpitation, dizziness, and followed up with cardiology, presents with concern for dizziness.  DDx includes arrhythmia, ACS, PE, anemia, electrolyte abnormalities, tamponade, dehydration, etoh withdrawal, other medication withdrawal or intoxication.  EKG evaluated bye me without acute abnormalities.  Given  duration of symptoms, normal bilateral pulses, normal XR, normal ddimer at last ED visit, low suspicion for dissection, PE by these previous work up and continuing symptoms.  Troponin negative x2 today, doubt ACS.  Labs completed and show no clincially significant anemia, sodium levels130 from 133, and 127.  CMP with mild anion gap, bicarb 19. Ketones in urine, glucose WNL no hx of DM, suspect related to etoh or starvation, lactic acid elevation likely setting of dehydration. Doubt sepsis, no infectious source.    Has had previous MRI [facility] for dizziness last eyar showing small vessel disease, consider possibility of demyelination.  Discussed given dizziness she feels with ambulation, will obtain screening head CT> If no acute changes on CT as etiology of dizziness and increased tremors.  Discussed if not significant findings on CT, do not feel transfer for MRI indicated in setting of weeks of symptoms, normal neurologic exam, similar symptoms in the past.  Recommend follow up with Neurology, continued follow up with Cardiology.  At this time suspect dehydration. Given IV fluids, awaiting result of CT and repeat lactic acid.          Final Clinical Impression(s) / ED Diagnoses Final diagnoses:  Palpitations  Lightheadedness  Dizziness    Rx / DC Orders ED Discharge Orders     None         Alvira Monday, MD 04/22/23 2159

## 2023-04-22 NOTE — Discharge Instructions (Addendum)
You were seen in the emergency department for your dizziness and heart palpitations.  You did appear mildly dehydrated on your labs and had improvement after fluids.  You should make sure that you are continuing to drink plenty of fluids and you should cut back or completely stop drinking alcohol to help prevent further dehydration.  You should follow-up with your primary doctor and cardiologist to have your symptoms rechecked and to have your blood work rechecked.  You should return to the emergency department if you are having worsening dizziness and passed out, you are unable to walk, you have repetitive vomiting or if you have any other new or concerning symptoms.

## 2023-04-22 NOTE — ED Notes (Signed)
Pt transported to imaging.

## 2023-04-22 NOTE — ED Provider Notes (Signed)
Patient signed out to me at 1500 by Dr. Dalene Seltzer pending CT head and repeat lactate after IV fluids.  In short this is a 57 year old female with a history of PVCs, hypertension, Graves' disease who presented to the emergency department with dizziness and heart palpitations.  She has been seen in the ED previously and has been evaluated by cardiology and is currently wearing a Holter monitor.  Patient has been hemodynamically stable here in no acute distress.  She was somewhat tremulous on exam and was given Valium with improvement.  Reportedly drinks 1 drink of alcohol per day.  Patient had labs performed that showed mild lactic elevation with ketones in the urine and a mildly elevated anion gap concerning for some mild dehydration was given IV fluids.  Reports that her dizziness is worse upon standing and walking will have orthostatics.  CT head shows no acute disease.  Repeat lactate is pending at this time.  Clinical Course as of 04/22/23 1657  Thu Apr 22, 2023  1655 Patient's repeat lactate is downtrending.  On reassessment, the patient reports that she feels significantly improved.  Is still mildly tremulous on exam but vitals are normal with no signs of severe alcohol withdrawal.  Orthostatics were normal.  She is tolerating p.o.  She stable for discharge home with close outpatient follow-up and was given strict return precautions. [VK]    Clinical Course User Index [VK] Melanie Maus, DO      Melanie Bush, [place] 04/22/23 [phone]

## 2023-04-22 NOTE — ED Notes (Signed)
D/c paperwork reviewed with pt, including follow up care.  All questions and/or concerns addressed at time of d/c.  No further needs expressed. . Pt verbalized understanding, Ambulatory with family to ED exit, NAD.   

## 2023-04-22 NOTE — ED Triage Notes (Signed)
Pt states she is having palpitations, dizziness and shaking.  Pt seen for the same in ED recently for hyponatremia, got a little better but worse today.  Pt has Holter monitor in place, followed by cardiology.  Pt has seen her PCP 2 weeks ago in which she was told her Na had improved.

## 2023-04-28 ENCOUNTER — Telehealth: Payer: Self-pay | Admitting: Cardiology

## 2023-04-28 MED ORDER — ACEBUTOLOL HCL 400 MG PO CAPS: 400.0000 mg | ORAL_CAPSULE | Freq: Every day | ORAL | 3 refills | Status: AC

## 2023-04-28 NOTE — Telephone Encounter (Signed)
*  STAT* If patient is at the pharmacy, call can be transferred to refill team.   1. Which medications need to be refilled? (please list name of each medication and dose if known) acebutolol (SECTRAL) 400 MG capsule  2. Which pharmacy/location (including street and city if local pharmacy) is medication to be sent to? [id number] - [place], [place] - [address] AT [address]OSS [address]   3. Do they need a 30 day or 90 day supply?  90 day supply

## 2023-04-28 NOTE — Telephone Encounter (Signed)
RX sent

## 2023-05-24 ENCOUNTER — Ambulatory Visit: Payer: BC Managed Care – PPO | Admitting: Cardiology
